# Patient Record
Sex: Male | Born: 1955 | Marital: Single
Health system: Southern US, Community
[De-identification: ages and names within clinical notes are randomized; demographics above are authoritative.]

## PROBLEM LIST (undated history)

## (undated) DIAGNOSIS — C3490 Malignant neoplasm of unspecified part of unspecified bronchus or lung: Secondary | ICD-10-CM

## (undated) DIAGNOSIS — I469 Cardiac arrest, cause unspecified: Secondary | ICD-10-CM

## (undated) DIAGNOSIS — F329 Major depressive disorder, single episode, unspecified: Secondary | ICD-10-CM

## (undated) DIAGNOSIS — R600 Localized edema: Secondary | ICD-10-CM

## (undated) DIAGNOSIS — F419 Anxiety disorder, unspecified: Secondary | ICD-10-CM

## (undated) DIAGNOSIS — G8929 Other chronic pain: Secondary | ICD-10-CM

## (undated) DIAGNOSIS — M549 Dorsalgia, unspecified: Secondary | ICD-10-CM

## (undated) DIAGNOSIS — I482 Chronic atrial fibrillation, unspecified: Secondary | ICD-10-CM

## (undated) DIAGNOSIS — B1920 Unspecified viral hepatitis C without hepatic coma: Secondary | ICD-10-CM

## (undated) DIAGNOSIS — J449 Chronic obstructive pulmonary disease, unspecified: Secondary | ICD-10-CM

## (undated) DIAGNOSIS — J9621 Acute and chronic respiratory failure with hypoxia: Secondary | ICD-10-CM

## (undated) DIAGNOSIS — F32A Depression, unspecified: Secondary | ICD-10-CM

## (undated) DIAGNOSIS — I509 Heart failure, unspecified: Secondary | ICD-10-CM

## (undated) DIAGNOSIS — J189 Pneumonia, unspecified organism: Secondary | ICD-10-CM

## (undated) HISTORY — PX: APPENDECTOMY: SHX54

## (undated) HISTORY — PX: TRACHEOSTOMY: SUR1362

## (undated) HISTORY — PX: TONSILLECTOMY: SUR1361

---

## 2018-06-11 ENCOUNTER — Inpatient Hospital Stay
Admit: 2018-06-11 | Discharge: 2018-07-24 | Disposition: A | Discharge status: Skilled Nursing Facility | Payer: Medicare Other | Attending: Internal Medicine | Admitting: Internal Medicine

## 2018-06-11 ENCOUNTER — Institutional Professional Consult (permissible substitution) (HOSPITAL_COMMUNITY): Payer: Medicare Other

## 2018-06-11 DIAGNOSIS — D649 Anemia, unspecified: Secondary | ICD-10-CM

## 2018-06-11 DIAGNOSIS — J9690 Respiratory failure, unspecified, unspecified whether with hypoxia or hypercapnia: Secondary | ICD-10-CM

## 2018-06-11 DIAGNOSIS — K769 Liver disease, unspecified: Secondary | ICD-10-CM

## 2018-06-11 DIAGNOSIS — R195 Other fecal abnormalities: Secondary | ICD-10-CM

## 2018-06-11 DIAGNOSIS — I469 Cardiac arrest, cause unspecified: Secondary | ICD-10-CM | POA: Diagnosis present

## 2018-06-11 DIAGNOSIS — J939 Pneumothorax, unspecified: Secondary | ICD-10-CM

## 2018-06-11 DIAGNOSIS — Z9889 Other specified postprocedural states: Secondary | ICD-10-CM

## 2018-06-11 DIAGNOSIS — Z930 Tracheostomy status: Secondary | ICD-10-CM

## 2018-06-11 DIAGNOSIS — I509 Heart failure, unspecified: Secondary | ICD-10-CM

## 2018-06-11 DIAGNOSIS — J189 Pneumonia, unspecified organism: Secondary | ICD-10-CM

## 2018-06-11 DIAGNOSIS — J449 Chronic obstructive pulmonary disease, unspecified: Secondary | ICD-10-CM | POA: Diagnosis present

## 2018-06-11 DIAGNOSIS — R600 Localized edema: Secondary | ICD-10-CM | POA: Diagnosis present

## 2018-06-11 DIAGNOSIS — Z931 Gastrostomy status: Secondary | ICD-10-CM

## 2018-06-11 DIAGNOSIS — Z4659 Encounter for fitting and adjustment of other gastrointestinal appliance and device: Secondary | ICD-10-CM

## 2018-06-11 DIAGNOSIS — J9621 Acute and chronic respiratory failure with hypoxia: Secondary | ICD-10-CM | POA: Diagnosis present

## 2018-06-11 HISTORY — DX: Heart failure, unspecified: I50.9

## 2018-06-11 HISTORY — DX: Localized edema: R60.0

## 2018-06-11 HISTORY — DX: Malignant neoplasm of unspecified part of unspecified bronchus or lung: C34.90

## 2018-06-11 HISTORY — DX: Pneumonia, unspecified organism: J18.9

## 2018-06-11 HISTORY — DX: Unspecified viral hepatitis C without hepatic coma: B19.20

## 2018-06-11 HISTORY — DX: Dorsalgia, unspecified: M54.9

## 2018-06-11 HISTORY — DX: Chronic obstructive pulmonary disease, unspecified: J44.9

## 2018-06-11 HISTORY — DX: Anxiety disorder, unspecified: F41.9

## 2018-06-11 HISTORY — DX: Depression, unspecified: F32.A

## 2018-06-11 HISTORY — DX: Cardiac arrest, cause unspecified: I46.9

## 2018-06-11 HISTORY — DX: Chronic atrial fibrillation, unspecified: I48.20

## 2018-06-11 HISTORY — DX: Other chronic pain: G89.29

## 2018-06-11 HISTORY — DX: Acute and chronic respiratory failure with hypoxia: J96.21

## 2018-06-11 HISTORY — DX: Major depressive disorder, single episode, unspecified: F32.9

## 2018-06-11 LAB — BLOOD GAS, ARTERIAL
Acid-Base Excess: 10.1 mmol/L — ABNORMAL HIGH (ref 0.0–2.0)
Bicarbonate: 34.6 mmol/L — ABNORMAL HIGH (ref 20.0–28.0)
FIO2: 100
O2 SAT: 89.9 %
PEEP/CPAP: 10 cmH2O
Patient temperature: 98.6
RATE: 20 resp/min
VT: 600 mL
pCO2 arterial: 50.1 mmHg — ABNORMAL HIGH (ref 32.0–48.0)
pH, Arterial: 7.453 — ABNORMAL HIGH (ref 7.350–7.450)
pO2, Arterial: 60 mmHg — ABNORMAL LOW (ref 83.0–108.0)

## 2018-06-12 ENCOUNTER — Other Ambulatory Visit (HOSPITAL_COMMUNITY): Payer: Medicare Other

## 2018-06-12 DIAGNOSIS — I469 Cardiac arrest, cause unspecified: Secondary | ICD-10-CM | POA: Diagnosis not present

## 2018-06-12 DIAGNOSIS — J9621 Acute and chronic respiratory failure with hypoxia: Secondary | ICD-10-CM | POA: Diagnosis not present

## 2018-06-12 DIAGNOSIS — R600 Localized edema: Secondary | ICD-10-CM

## 2018-06-12 DIAGNOSIS — I509 Heart failure, unspecified: Secondary | ICD-10-CM

## 2018-06-12 DIAGNOSIS — J449 Chronic obstructive pulmonary disease, unspecified: Secondary | ICD-10-CM

## 2018-06-12 DIAGNOSIS — J189 Pneumonia, unspecified organism: Secondary | ICD-10-CM

## 2018-06-12 LAB — BLOOD GAS, ARTERIAL
ACID-BASE EXCESS: 10.8 mmol/L — AB (ref 0.0–2.0)
Bicarbonate: 35.2 mmol/L — ABNORMAL HIGH (ref 20.0–28.0)
FIO2: 100
LHR: 20 {breaths}/min
MECHVT: 600 mL
O2 Saturation: 58.1 %
PATIENT TEMPERATURE: 98.6
PCO2 ART: 50 mmHg — AB (ref 32.0–48.0)
PEEP/CPAP: 12 cmH2O
pH, Arterial: 7.461 — ABNORMAL HIGH (ref 7.350–7.450)
pO2, Arterial: 32.7 mmHg — CL (ref 83.0–108.0)

## 2018-06-12 LAB — CBC WITH DIFFERENTIAL/PLATELET
Abs Immature Granulocytes: 0.1 10*3/uL (ref 0.0–0.1)
BASOS ABS: 0 10*3/uL (ref 0.0–0.1)
Basophils Relative: 0 %
Eosinophils Absolute: 0.2 10*3/uL (ref 0.0–0.7)
Eosinophils Relative: 2 %
HEMATOCRIT: 36.2 % — AB (ref 39.0–52.0)
HEMOGLOBIN: 11 g/dL — AB (ref 13.0–17.0)
Immature Granulocytes: 1 %
LYMPHS PCT: 8 %
Lymphs Abs: 0.7 10*3/uL (ref 0.7–4.0)
MCH: 31.2 pg (ref 26.0–34.0)
MCHC: 30.4 g/dL (ref 30.0–36.0)
MCV: 102.5 fL — AB (ref 78.0–100.0)
MONO ABS: 0.7 10*3/uL (ref 0.1–1.0)
Monocytes Relative: 8 %
Neutro Abs: 6.8 10*3/uL (ref 1.7–7.7)
Neutrophils Relative %: 81 %
Platelets: 166 10*3/uL (ref 150–400)
RBC: 3.53 MIL/uL — ABNORMAL LOW (ref 4.22–5.81)
RDW: 13.9 % (ref 11.5–15.5)
WBC: 8.5 10*3/uL (ref 4.0–10.5)

## 2018-06-12 LAB — COMPREHENSIVE METABOLIC PANEL
ALK PHOS: 80 U/L (ref 38–126)
ALT: 18 U/L (ref 0–44)
AST: 16 U/L (ref 15–41)
Albumin: 2.6 g/dL — ABNORMAL LOW (ref 3.5–5.0)
Anion gap: 10 (ref 5–15)
BILIRUBIN TOTAL: 1.4 mg/dL — AB (ref 0.3–1.2)
BUN: 20 mg/dL (ref 8–23)
CO2: 33 mmol/L — ABNORMAL HIGH (ref 22–32)
Calcium: 8.8 mg/dL — ABNORMAL LOW (ref 8.9–10.3)
Chloride: 98 mmol/L (ref 98–111)
Creatinine, Ser: 0.68 mg/dL (ref 0.61–1.24)
GFR calc Af Amer: >60 mL/min (ref >60)
GFR calc non Af Amer: >60 mL/min (ref >60)
Glucose, Bld: 97 mg/dL (ref 70–99)
POTASSIUM: 3.8 mmol/L (ref 3.5–5.1)
Sodium: 141 mmol/L (ref 135–145)
TOTAL PROTEIN: 6.3 g/dL — AB (ref 6.5–8.1)

## 2018-06-12 NOTE — Consult Note (Signed)
Pulmonary [place]  PULMONARY SERVICE  Date of Service: 06/12/2018  PULMONARY CRITICAL CARE CONSULT   Rick Moyer  GEZ:[id number]  DOB: 01/15/1956   DOA: 06/11/2018  Referring Physician: Merton Border, MD  HPI: Rick Moyer is a 62 y.o. male seen for follow up of Acute on Chronic Respiratory Failure.  Patient was transferred to our facility for further management and weaning.  Apparently has a history of very severe COPD chronic leg swelling came into the hospital after having suffered a cardiac arrest and had a pole.  When EMS arrived patient was in full cardiac arrest.  Patient was started on ACLS protocol and intubated.  Patient was then transferred to the hospital and admitted to the ICU.  Apparently was felt to have a pneumonia treated with antibiotics.  Patient also was noted to have a pneumothorax requiring a chest tube.  Patient also developed complications related to development of atrial fibrillation which was treated with anticoagulation.  Patient was unable to wean off the ventilator.  Eventually had to have a tracheostomy placed.  Transferred now to our facility for management and weaning  Review of Systems:  ROS performed and is unremarkable other than noted above.  Past Medical History:  Diagnosis Date  . Acute on chronic respiratory failure with hypoxia ([place])   . Anxiety   . Bilateral lower extremity edema   . Cardiac arrest ([place])   . Chronic atrial fibrillation ([place])   . Chronic back pain   . Chronic congestive heart failure ([place])   . COPD, very severe ([place])   . Depression   . Hepatitis C   . Lung cancer [facility])     Past Surgical History:  Procedure Laterality Date  . APPENDECTOMY    . TONSILLECTOMY    . TRACHEOSTOMY      Social History:    reports that he has quit smoking. He has never used smokeless tobacco. He reports that he drank alcohol. He reports that he has current or past drug history.  Family  History: Non-Contributory to the present illness    Medications: Reviewed on Rounds  Physical Exam:  Vitals: Temperature 99.0 pulse 94 respiratory rate 18 blood pressure 124/78 saturations are 90%  Ventilator Settings mode of ventilation assist control FiO2 85% tidal volume 600 PEEP 10  . General: Comfortable at this time . Eyes: Grossly normal lids, irises & conjunctiva . ENT: grossly tongue is normal . Neck: no obvious mass . Cardiovascular: S1-S2 normal no gallop . Respiratory: Few scattered distant rhonchi are noted . Abdomen: Soft and nontender . Skin: no rash seen on limited exam . Musculoskeletal: not rigid . Psychiatric:unable to assess . Neurologic: no seizure no involuntary movements         Labs on Admission:  Basic Metabolic Panel: Recent Labs  Lab 06/12/18 0630  NA 141  K 3.8  CL 98  CO2 33*  GLUCOSE 97  BUN 20  CREATININE 0.68  CALCIUM 8.8*    Recent Labs  Lab 06/11/18 2020 06/12/18 0030  PHART 7.453* 7.461*  PCO2ART 50.1* 50.0*  PO2ART 60.0* 32.7*  HCO3 34.6* 35.2*  O2SAT 89.9 58.1    Liver Function Tests: Recent Labs  Lab 06/12/18 0630  AST 16  ALT 18  ALKPHOS 80  BILITOT 1.4*  PROT 6.3*  ALBUMIN 2.6*   No results for input(s): LIPASE, AMYLASE in the last 168 hours. No results for input(s): AMMONIA in the last 168 hours.  CBC: Recent  Labs  Lab 06/12/18 0630  WBC 8.5  NEUTROABS 6.8  HGB 11.0*  HCT 36.2*  MCV 102.5*  PLT 166    Cardiac Enzymes: No results for input(s): CKTOTAL, CKMB, CKMBINDEX, TROPONINI in the last 168 hours.  BNP (last 3 results) No results for input(s): BNP in the last 8760 hours.  ProBNP (last 3 results) No results for input(s): PROBNP in the last 8760 hours.   Radiological Exams on Admission: Dg Abd 1 View  Result Date: 06/12/2018 CLINICAL DATA:  Ventilated patient. Subsequent nasogastric tube placement. EXAM: ABDOMEN - 1 VIEW COMPARISON:  Abdominal radiograph of June 12, 2018  FINDINGS: There has been interval advancement of the nasogastric tube. Both the tip and proximal port appear lie within the gastric body. IMPRESSION: Interval advancement of the nasogastric tube with good positioning of both the tip and proximal port in the gastric body. Electronically Signed   By: David  Martinique M.D.   On: 06/12/2018 10:49   Dg Abd 1 View  Result Date: 06/12/2018 CLINICAL DATA:  Status post nasogastric tube placement. Bill aided patient with tracheostomy. EXAM: ABDOMEN - 1 VIEW COMPARISON:  Abdominal radiograph of June 11, 2018 FINDINGS: The radiodense tipped feeding tube is been removed. An esophagogastric tube has been placed whose tip projects in the gastric cardia and the proximal port above the GE junction. IMPRESSION: Advancement of the nasogastric tube by between 10 and 15 cm is recommended to assure that both the proximal port and tip lie below the GE junction. Electronically Signed   By: David  Martinique M.D.   On: 06/12/2018 09:27   Dg Chest Port 1 View  Result Date: 06/12/2018 CLINICAL DATA:  Hypoxia.  History of chest tube placement. EXAM: PORTABLE CHEST 1 VIEW COMPARISON:  Chest radiograph yesterday at 1535 hour FINDINGS: Previous enteric tube is no longer visualized. Previous right central line is no longer visualized. Tracheostomy tube projects over the thoracic inlet. Left upper extremity PICC tip in the proximal SVC. The lungs are hyperinflated with emphysema. Worsening pleuroparenchymal opacity at the right lung base. Left lung base and costophrenic angle excluded from the field of view. The heart is enlarged. No visualized pneumothorax. The left rib fractures on prior exam are not as well seen currently. IMPRESSION: 1. Progressive pleuroparenchymal opacity at the right lung base suspicious for increasing pleural effusion and atelectasis or airspace disease. 2. Again seen hyperinflation and likely emphysema. Left lung base and costophrenic angle not included in the field  of view. 3. Right central line and enteric tube have been removed in the interim. Tracheostomy tube at the thoracic inlet. 4. No visualized pneumothorax. Electronically Signed   By: Keith Rake M.D.   On: 06/12/2018 01:27   Dg Chest Port 1 View  Result Date: 06/11/2018 CLINICAL DATA:  Pneumothorax, line and tube placement EXAM: PORTABLE CHEST 1 VIEW COMPARISON:  None. FINDINGS: Left upper extremity catheter tip overlies the SVC. Esophageal tube extends below diaphragm but is not included. Apparent tracheostomy tube, tip poorly visible. Hyperinflation with probable emphysematous disease. Increased airspace disease at the right greater than left lung bases. Mild cardiomegaly with aortic atherosclerosis. Vague pleural and parenchymal opacity in the left lower lung. No definitive pneumothorax. Old appearing left-sided rib fractures. Age indeterminate left third, fourth, and fifth rib fractures. IMPRESSION: 1. No definitive pneumothorax seen 2. Hyperinflation with probable emphysematous disease. Increased airspace disease at the right greater than left lung base may reflect atelectasis or pneumonia. Pleural and parenchymal opacity in the left lower lung, potential  scarring versus nodule. No priors available for comparison. Consider correlation with chest CT. 3. Mild cardiomegaly. 4. Multiple left-sided rib fractures, left third through fifth rib fractures appear age indeterminate/possibly acute. Electronically Signed   By: Donavan Foil M.D.   On: 06/11/2018 16:00   Dg Abd Portable 1v  Result Date: 06/11/2018 CLINICAL DATA:  NG tube placement EXAM: PORTABLE ABDOMEN - 1 VIEW COMPARISON:  None. FINDINGS: Esophageal tube tip overlies the expected location of second portion of duodenum. Visible gas pattern is nonobstructed IMPRESSION: Esophageal tube tip projects over expected location of proximal duodenum. Electronically Signed   By: Donavan Foil M.D.   On: 06/11/2018 16:00    Assessment/Plan Active  Problems:   Acute on chronic respiratory failure with hypoxia (HCC)   COPD, very severe (HCC)   Bilateral lower extremity edema   Chronic congestive heart failure (HCC)   Cardiac arrest (HCC)   Bilateral pneumonia   1. Acute on chronic respiratory failure with hypoxia patient has significant hypoxia at this time requiring 85% FiO2.  The chest x-ray shows emphysematous changes with airspace disease reflecting probable pneumonia.  Recommended doing a possible CT scan.  Patient was also noted to have multiple rib fractures.  We are going to try to titrate the oxygen down as tolerated currently is on a PEEP of 10 we will try to adjust the PEEP according to the FiO2 requirements. 2. Bilateral pneumonitis probable pneumonia represented on the chest x-ray.  Would consider doing a CT scan of the chest to further evaluate. 3. Severe COPD patient will be continued on current regimen.  Requiring ongoing oxygen therapy as well as aggressive meds for his COPD. 4. Bilateral edema monitor fluid status diuresis tolerated. 5. Chronic congestive heart failure causing above we will continue with supportive care. 6. Status post cardiac arrest treated we will continue to follow  I have personally seen and evaluated the patient, evaluated laboratory and imaging results, formulated the assessment and plan and placed orders. The Patient requires high complexity decision making for assessment and support.  Case was discussed on Rounds with the Respiratory Therapy Staff Time Spent 33minutes  Allyne Gee, MD [facility] Pulmonary Critical Care Medicine Sleep Medicine

## 2018-06-13 DIAGNOSIS — I469 Cardiac arrest, cause unspecified: Secondary | ICD-10-CM | POA: Diagnosis not present

## 2018-06-13 DIAGNOSIS — J9621 Acute and chronic respiratory failure with hypoxia: Secondary | ICD-10-CM | POA: Diagnosis not present

## 2018-06-13 DIAGNOSIS — R600 Localized edema: Secondary | ICD-10-CM | POA: Diagnosis not present

## 2018-06-13 DIAGNOSIS — J189 Pneumonia, unspecified organism: Secondary | ICD-10-CM | POA: Diagnosis not present

## 2018-06-13 NOTE — Progress Notes (Signed)
Pulmonary Critical Care Medicine [place]   PULMONARY CRITICAL CARE SERVICE  PROGRESS NOTE  Date of Service: 06/13/2018  Rick Moyer  HDQ:[id number]  DOB: 1956/08/16   DOA: 06/11/2018  Referring Physician: Merton Border, MD  HPI: Rick Moyer is a 62 y.o. male seen for follow up of Acute on Chronic Respiratory Failure.  Patient is still on 65% oxygen.  His volumes are about patient has not been able to tolerate the weaning.  He is still also on a PEEP of 10  Medications: Reviewed on Rounds  Physical Exam:  Vitals: Temperature 97.1 pulse 85 respiratory rate 27 blood pressure 100/57 saturations are 94%  Ventilator Settings mode of ventilation is assist control FiO2 65% tidal volume 800 PEEP 10  . General: Comfortable at this time . Eyes: Grossly normal lids, irises & conjunctiva . ENT: grossly tongue is normal . Neck: no obvious mass . Cardiovascular: S1 S2 normal no gallop . Respiratory: No rhonchi or rales are noted . Abdomen: soft . Skin: no rash seen on limited exam . Musculoskeletal: not rigid . Psychiatric:unable to assess . Neurologic: no seizure no involuntary movements         Lab Data:   Basic Metabolic Panel: Recent Labs  Lab 06/12/18 0630  NA 141  K 3.8  CL 98  CO2 33*  GLUCOSE 97  BUN 20  CREATININE 0.68  CALCIUM 8.8*    ABG: Recent Labs  Lab 06/11/18 2020 06/12/18 0030  PHART 7.453* 7.461*  PCO2ART 50.1* 50.0*  PO2ART 60.0* 32.7*  HCO3 34.6* 35.2*  O2SAT 89.9 58.1    Liver Function Tests: Recent Labs  Lab 06/12/18 0630  AST 16  ALT 18  ALKPHOS 80  BILITOT 1.4*  PROT 6.3*  ALBUMIN 2.6*   No results for input(s): LIPASE, AMYLASE in the last 168 hours. No results for input(s): AMMONIA in the last 168 hours.  CBC: Recent Labs  Lab 06/12/18 0630  WBC 8.5  NEUTROABS 6.8  HGB 11.0*  HCT 36.2*  MCV 102.5*  PLT 166    Cardiac Enzymes: No results for input(s): CKTOTAL, CKMB, CKMBINDEX, TROPONINI  in the last 168 hours.  BNP (last 3 results) No results for input(s): BNP in the last 8760 hours.  ProBNP (last 3 results) No results for input(s): PROBNP in the last 8760 hours.  Radiological Exams: Dg Abd 1 View  Result Date: 06/12/2018 CLINICAL DATA:  Ventilated patient. Subsequent nasogastric tube placement. EXAM: ABDOMEN - 1 VIEW COMPARISON:  Abdominal radiograph of June 12, 2018 FINDINGS: There has been interval advancement of the nasogastric tube. Both the tip and proximal port appear lie within the gastric body. IMPRESSION: Interval advancement of the nasogastric tube with good positioning of both the tip and proximal port in the gastric body. Electronically Signed   By: David  Martinique M.D.   On: 06/12/2018 10:49   Dg Abd 1 View  Result Date: 06/12/2018 CLINICAL DATA:  Status post nasogastric tube placement. Bill aided patient with tracheostomy. EXAM: ABDOMEN - 1 VIEW COMPARISON:  Abdominal radiograph of June 11, 2018 FINDINGS: The radiodense tipped feeding tube is been removed. An esophagogastric tube has been placed whose tip projects in the gastric cardia and the proximal port above the GE junction. IMPRESSION: Advancement of the nasogastric tube by between 10 and 15 cm is recommended to assure that both the proximal port and tip lie below the GE junction. Electronically Signed   By: David  Martinique M.D.   On: 06/12/2018  09:27   Dg Chest Port 1 View  Result Date: 06/12/2018 CLINICAL DATA:  Hypoxia.  History of chest tube placement. EXAM: PORTABLE CHEST 1 VIEW COMPARISON:  Chest radiograph yesterday at 1535 hour FINDINGS: Previous enteric tube is no longer visualized. Previous right central line is no longer visualized. Tracheostomy tube projects over the thoracic inlet. Left upper extremity PICC tip in the proximal SVC. The lungs are hyperinflated with emphysema. Worsening pleuroparenchymal opacity at the right lung base. Left lung base and costophrenic angle excluded from the  field of view. The heart is enlarged. No visualized pneumothorax. The left rib fractures on prior exam are not as well seen currently. IMPRESSION: 1. Progressive pleuroparenchymal opacity at the right lung base suspicious for increasing pleural effusion and atelectasis or airspace disease. 2. Again seen hyperinflation and likely emphysema. Left lung base and costophrenic angle not included in the field of view. 3. Right central line and enteric tube have been removed in the interim. Tracheostomy tube at the thoracic inlet. 4. No visualized pneumothorax. Electronically Signed   By: Keith Rake M.D.   On: 06/12/2018 01:27   Dg Chest Port 1 View  Result Date: 06/11/2018 CLINICAL DATA:  Pneumothorax, line and tube placement EXAM: PORTABLE CHEST 1 VIEW COMPARISON:  None. FINDINGS: Left upper extremity catheter tip overlies the SVC. Esophageal tube extends below diaphragm but is not included. Apparent tracheostomy tube, tip poorly visible. Hyperinflation with probable emphysematous disease. Increased airspace disease at the right greater than left lung bases. Mild cardiomegaly with aortic atherosclerosis. Vague pleural and parenchymal opacity in the left lower lung. No definitive pneumothorax. Old appearing left-sided rib fractures. Age indeterminate left third, fourth, and fifth rib fractures. IMPRESSION: 1. No definitive pneumothorax seen 2. Hyperinflation with probable emphysematous disease. Increased airspace disease at the right greater than left lung base may reflect atelectasis or pneumonia. Pleural and parenchymal opacity in the left lower lung, potential scarring versus nodule. No priors available for comparison. Consider correlation with chest CT. 3. Mild cardiomegaly. 4. Multiple left-sided rib fractures, left third through fifth rib fractures appear age indeterminate/possibly acute. Electronically Signed   By: Donavan Foil M.D.   On: 06/11/2018 16:00   Dg Abd Portable 1v  Result Date:  06/11/2018 CLINICAL DATA:  NG tube placement EXAM: PORTABLE ABDOMEN - 1 VIEW COMPARISON:  None. FINDINGS: Esophageal tube tip overlies the expected location of second portion of duodenum. Visible gas pattern is nonobstructed IMPRESSION: Esophageal tube tip projects over expected location of proximal duodenum. Electronically Signed   By: Donavan Foil M.D.   On: 06/11/2018 16:00    Assessment/Plan Active Problems:   Acute on chronic respiratory failure with hypoxia (HCC)   COPD, very severe (HCC)   Bilateral lower extremity edema   Chronic congestive heart failure (HCC)   Cardiac arrest (HCC)   Bilateral pneumonia   1. Acute on chronic respiratory failure with hypoxia patient still remains on full support and is on high oxygen requirements.  We will try to titrate oxygen down titrate PEEP after oxygen.  Follow-up chest x-ray had shown multiple rib fractures some hyperinflation no pneumothorax and also some opacities could very well represent pulmonary contusion 2. COPD severe disease we will continue with the present management. 3. Bilateral edema monitor fluid status titrate as tolerated and diuresis tolerated. 4. Chronic congestive heart failure at baseline we will continue with secretion management pulmonary toilet. 5. Cardiac arrest rhythm is been stable 6. Bilateral pneumonia treated with antibiotics we will follow   I  have personally seen and evaluated the patient, evaluated laboratory and imaging results, formulated the assessment and plan and placed orders. The Patient requires high complexity decision making for assessment and support.  Case was discussed on Rounds with the Respiratory Therapy Staff  Allyne Gee, MD [facility] Pulmonary Critical Care Medicine Sleep Medicine

## 2018-06-16 LAB — BASIC METABOLIC PANEL
Anion gap: 9 (ref 5–15)
BUN: 33 mg/dL — AB (ref 8–23)
CHLORIDE: 95 mmol/L — AB (ref 98–111)
CO2: 33 mmol/L — ABNORMAL HIGH (ref 22–32)
CREATININE: 0.81 mg/dL (ref 0.61–1.24)
Calcium: 8.8 mg/dL — ABNORMAL LOW (ref 8.9–10.3)
GFR calc Af Amer: >60 mL/min (ref >60)
GFR calc non Af Amer: >60 mL/min (ref >60)
Glucose, Bld: 173 mg/dL — ABNORMAL HIGH (ref 70–99)
Potassium: 3.7 mmol/L (ref 3.5–5.1)
SODIUM: 137 mmol/L (ref 135–145)

## 2018-06-17 ENCOUNTER — Encounter: Payer: Self-pay | Admitting: Internal Medicine

## 2018-06-17 DIAGNOSIS — I509 Heart failure, unspecified: Secondary | ICD-10-CM

## 2018-06-17 DIAGNOSIS — I469 Cardiac arrest, cause unspecified: Secondary | ICD-10-CM | POA: Diagnosis present

## 2018-06-17 DIAGNOSIS — J9621 Acute and chronic respiratory failure with hypoxia: Secondary | ICD-10-CM | POA: Diagnosis not present

## 2018-06-17 DIAGNOSIS — J189 Pneumonia, unspecified organism: Secondary | ICD-10-CM

## 2018-06-17 DIAGNOSIS — R600 Localized edema: Secondary | ICD-10-CM | POA: Diagnosis not present

## 2018-06-17 DIAGNOSIS — J449 Chronic obstructive pulmonary disease, unspecified: Secondary | ICD-10-CM | POA: Diagnosis present

## 2018-06-17 HISTORY — DX: Pneumonia, unspecified organism: J18.9

## 2018-06-17 NOTE — Progress Notes (Signed)
Pulmonary Critical Care Medicine [place]   PULMONARY CRITICAL CARE SERVICE  PROGRESS NOTE  Date of Service: 06/17/2018  Rick Moyer  ZOX:[id number]  DOB: 1955-10-14   DOA: 06/11/2018  Referring Physician: Merton Border, MD  HPI: Rick Moyer is a 62 y.o. male seen for follow up of Acute on Chronic Respiratory Failure.  Patient is on full support right now is on assist control mode has been on 60% oxygen.  Volumes are still high at about 800 currently PEEP was decreased down to 7  Medications: Reviewed on Rounds  Physical Exam:  Vitals: Temperature 97.6 pulse 72 respiratory rate 18 blood pressure 147/61 saturations 98%  Ventilator Settings mode of ventilation assist control FiO2 60% tidal volume 900 PEEP 7  . General: Comfortable at this time . Eyes: Grossly normal lids, irises & conjunctiva . ENT: grossly tongue is normal . Neck: no obvious mass . Cardiovascular: S1 S2 normal no gallop . Respiratory: Coarse breath sounds some rhonchi are noted . Abdomen: soft . Skin: no rash seen on limited exam . Musculoskeletal: not rigid . Psychiatric:unable to assess . Neurologic: no seizure no involuntary movements         Lab Data:   Basic Metabolic Panel: Recent Labs  Lab 06/12/18 0630 06/16/18 0618  NA 141 137  K 3.8 3.7  CL 98 95*  CO2 33* 33*  GLUCOSE 97 173*  BUN 20 33*  CREATININE 0.68 0.81  CALCIUM 8.8* 8.8*    ABG: Recent Labs  Lab 06/11/18 2020 06/12/18 0030  PHART 7.453* 7.461*  PCO2ART 50.1* 50.0*  PO2ART 60.0* 32.7*  HCO3 34.6* 35.2*  O2SAT 89.9 58.1    Liver Function Tests: Recent Labs  Lab 06/12/18 0630  AST 16  ALT 18  ALKPHOS 80  BILITOT 1.4*  PROT 6.3*  ALBUMIN 2.6*   No results for input(s): LIPASE, AMYLASE in the last 168 hours. No results for input(s): AMMONIA in the last 168 hours.  CBC: Recent Labs  Lab 06/12/18 0630  WBC 8.5  NEUTROABS 6.8  HGB 11.0*  HCT 36.2*  MCV 102.5*  PLT 166     Cardiac Enzymes: No results for input(s): CKTOTAL, CKMB, CKMBINDEX, TROPONINI in the last 168 hours.  BNP (last 3 results) No results for input(s): BNP in the last 8760 hours.  ProBNP (last 3 results) No results for input(s): PROBNP in the last 8760 hours.  Radiological Exams: No results found.  Assessment/Plan Active Problems:   Acute on chronic respiratory failure with hypoxia (HCC)   COPD, very severe (HCC)   Bilateral lower extremity edema   Chronic congestive heart failure (HCC)   Cardiac arrest (HCC)   Bilateral pneumonia   1. Acute on chronic respiratory failure with hypoxia we will continue with full vent support right now I will try to keep the volumes on the lower side.  Simply because of the history of pneumothorax. 2. Very severe COPD continue with present management. 3. Bilateral edema treated we will continue with supportive care.  Follow the fluid status 4. Chronic congestive heart failure at baseline monitor fluid status 5. Cardiac arrest rhythm has been stable 6. Bilateral pneumonia treated with antibiotics we will monitor and also follow-up on x-ray   I have personally seen and evaluated the patient, evaluated laboratory and imaging results, formulated the assessment and plan and placed orders. The Patient requires high complexity decision making for assessment and support.  Case was discussed on Rounds with the Respiratory Therapy Staff  Allyne Gee, MD [facility] Pulmonary Critical Care Medicine Sleep Medicine

## 2018-06-18 DIAGNOSIS — J9621 Acute and chronic respiratory failure with hypoxia: Secondary | ICD-10-CM | POA: Diagnosis not present

## 2018-06-18 DIAGNOSIS — I469 Cardiac arrest, cause unspecified: Secondary | ICD-10-CM | POA: Diagnosis not present

## 2018-06-18 DIAGNOSIS — R600 Localized edema: Secondary | ICD-10-CM | POA: Diagnosis not present

## 2018-06-18 DIAGNOSIS — J189 Pneumonia, unspecified organism: Secondary | ICD-10-CM | POA: Diagnosis not present

## 2018-06-18 NOTE — Progress Notes (Signed)
Pulmonary Critical Care Medicine [place]   PULMONARY CRITICAL CARE SERVICE  PROGRESS NOTE  Date of Service: 06/18/2018  Rick Moyer  HEN:[id number]  DOB: 1955-11-09   DOA: 06/11/2018  Referring Physician: Merton Border, MD  HPI: Rick Moyer is a 62 y.o. male seen for follow up of Acute on Chronic Respiratory Failure.  Remains on the ventilator and full support oxygen has been decreased down to 50% so he is doing a little bit better but still not able to fully wean.  Medications: Reviewed on Rounds  Physical Exam:  Vitals: Temperature 96.7 pulse 77 respiratory rate 17 blood pressure 108/77 saturations 94%  Ventilator Settings mode of ventilation assist control FiO2 50% tidal volume 600 PEEP 10  . General: Comfortable at this time . Eyes: Grossly normal lids, irises & conjunctiva . ENT: grossly tongue is normal . Neck: no obvious mass . Cardiovascular: S1 S2 normal no gallop . Respiratory: Few rhonchi no rales are noted . Abdomen: soft . Skin: no rash seen on limited exam . Musculoskeletal: not rigid . Psychiatric:unable to assess . Neurologic: no seizure no involuntary movements         Lab Data:   Basic Metabolic Panel: Recent Labs  Lab 06/12/18 0630 06/16/18 0618  NA 141 137  K 3.8 3.7  CL 98 95*  CO2 33* 33*  GLUCOSE 97 173*  BUN 20 33*  CREATININE 0.68 0.81  CALCIUM 8.8* 8.8*    ABG: Recent Labs  Lab 06/11/18 2020 06/12/18 0030  PHART 7.453* 7.461*  PCO2ART 50.1* 50.0*  PO2ART 60.0* 32.7*  HCO3 34.6* 35.2*  O2SAT 89.9 58.1    Liver Function Tests: Recent Labs  Lab 06/12/18 0630  AST 16  ALT 18  ALKPHOS 80  BILITOT 1.4*  PROT 6.3*  ALBUMIN 2.6*   No results for input(s): LIPASE, AMYLASE in the last 168 hours. No results for input(s): AMMONIA in the last 168 hours.  CBC: Recent Labs  Lab 06/12/18 0630  WBC 8.5  NEUTROABS 6.8  HGB 11.0*  HCT 36.2*  MCV 102.5*  PLT 166    Cardiac Enzymes: No  results for input(s): CKTOTAL, CKMB, CKMBINDEX, TROPONINI in the last 168 hours.  BNP (last 3 results) No results for input(s): BNP in the last 8760 hours.  ProBNP (last 3 results) No results for input(s): PROBNP in the last 8760 hours.  Radiological Exams: No results found.  Assessment/Plan Active Problems:   Acute on chronic respiratory failure with hypoxia (HCC)   COPD, very severe (HCC)   Bilateral lower extremity edema   Chronic congestive heart failure (HCC)   Cardiac arrest (HCC)   Bilateral pneumonia   1. Acute on chronic respiratory failure with hypoxia we will continue with full vent support try to continue to wean the FiO2 down.  Also try to wean the PEEP down and hopefully we can begin him on a pressure support wean. 2. COPD very severe continue with supportive care current medications as tolerated. 3. Bilateral lower extremity edema follow the fluid status diuresis as tolerated. 4. Chronic congestive heart failure as noted above diurese as tolerated.  We will continue with supportive care monitor fluid status 5. Status post cardiac arrest rhythm has been stable 6. Bilateral pneumonia follow-up x-ray as necessary has been treated   I have personally seen and evaluated the patient, evaluated laboratory and imaging results, formulated the assessment and plan and placed orders. The Patient requires high complexity decision making for assessment and support.  Case was discussed on Rounds with the Respiratory Therapy Staff  Allyne Gee, MD [facility] Pulmonary Critical Care Medicine Sleep Medicine

## 2018-06-19 DIAGNOSIS — J189 Pneumonia, unspecified organism: Secondary | ICD-10-CM | POA: Diagnosis not present

## 2018-06-19 DIAGNOSIS — R600 Localized edema: Secondary | ICD-10-CM | POA: Diagnosis not present

## 2018-06-19 DIAGNOSIS — J9621 Acute and chronic respiratory failure with hypoxia: Secondary | ICD-10-CM | POA: Diagnosis not present

## 2018-06-19 DIAGNOSIS — I469 Cardiac arrest, cause unspecified: Secondary | ICD-10-CM | POA: Diagnosis not present

## 2018-06-19 LAB — OCCULT BLOOD X 1 CARD TO LAB, STOOL: Fecal Occult Bld: POSITIVE — AB

## 2018-06-19 NOTE — Progress Notes (Signed)
Pulmonary Critical Care Medicine [place]   PULMONARY CRITICAL CARE SERVICE  PROGRESS NOTE  Date of Service: 06/19/2018  Rick Moyer  PYP:[id number]  DOB: Aug 18, 1956   DOA: 06/11/2018  Referring Physician: Merton Border, MD  HPI: Rick Moyer is a 62 y.o. male seen for follow up of Acute on Chronic Respiratory Failure.  Patient right now is on full support and is weaning on pressure support.  Currently is requiring 50% oxygen with a pressure support of 12 and a PEEP of 8  Medications: Reviewed on Rounds  Physical Exam:  Vitals: Temperature 98.0 pulse 74 respiratory rate 23 blood pressure 124/56 saturations 98%  Ventilator Settings mode of ventilation pressure support FiO2 50% tidal volume 341 pressure support 12 PEEP 8  . General: Comfortable at this time . Eyes: Grossly normal lids, irises & conjunctiva . ENT: grossly tongue is normal . Neck: no obvious mass . Cardiovascular: S1 S2 normal no gallop . Respiratory: No rhonchi or rales are noted . Abdomen: soft . Skin: no rash seen on limited exam . Musculoskeletal: not rigid . Psychiatric:unable to assess . Neurologic: no seizure no involuntary movements         Lab Data:   Basic Metabolic Panel: Recent Labs  Lab 06/16/18 0618  NA 137  K 3.7  CL 95*  CO2 33*  GLUCOSE 173*  BUN 33*  CREATININE 0.81  CALCIUM 8.8*    ABG: No results for input(s): PHART, PCO2ART, PO2ART, HCO3, O2SAT in the last 168 hours.  Liver Function Tests: No results for input(s): AST, ALT, ALKPHOS, BILITOT, PROT, ALBUMIN in the last 168 hours. No results for input(s): LIPASE, AMYLASE in the last 168 hours. No results for input(s): AMMONIA in the last 168 hours.  CBC: No results for input(s): WBC, NEUTROABS, HGB, HCT, MCV, PLT in the last 168 hours.  Cardiac Enzymes: No results for input(s): CKTOTAL, CKMB, CKMBINDEX, TROPONINI in the last 168 hours.  BNP (last 3 results) No results for input(s): BNP in the  last 8760 hours.  ProBNP (last 3 results) No results for input(s): PROBNP in the last 8760 hours.  Radiological Exams: No results found.  Assessment/Plan Active Problems:   Acute on chronic respiratory failure with hypoxia (HCC)   COPD, very severe (HCC)   Bilateral lower extremity edema   Chronic congestive heart failure (HCC)   Cardiac arrest (HCC)   Bilateral pneumonia   1. Acute on chronic respiratory failure with hypoxia we will continue with the weaning on pressure support mode.  Patient should be able to go as tolerated.  Continue with pulmonary toilet supportive care. 2. Severe COPD continue with present management. 3. Bilateral edema diuresis as tolerated. 4. Chronic congestive heart failure monitor fluid status. 5. Cardiac arrest rhythm has been stable 6. Bilateral pneumonia treated we will continue to follow-up x-rays as needed   I have personally seen and evaluated the patient, evaluated laboratory and imaging results, formulated the assessment and plan and placed orders. The Patient requires high complexity decision making for assessment and support.  Case was discussed on Rounds with the Respiratory Therapy Staff  Allyne Gee, MD [facility] Pulmonary Critical Care Medicine Sleep Medicine

## 2018-06-20 ENCOUNTER — Other Ambulatory Visit (HOSPITAL_COMMUNITY): Payer: Medicare Other

## 2018-06-20 DIAGNOSIS — J9621 Acute and chronic respiratory failure with hypoxia: Secondary | ICD-10-CM | POA: Diagnosis not present

## 2018-06-20 DIAGNOSIS — I469 Cardiac arrest, cause unspecified: Secondary | ICD-10-CM | POA: Diagnosis not present

## 2018-06-20 DIAGNOSIS — R600 Localized edema: Secondary | ICD-10-CM | POA: Diagnosis not present

## 2018-06-20 DIAGNOSIS — J189 Pneumonia, unspecified organism: Secondary | ICD-10-CM | POA: Diagnosis not present

## 2018-06-20 LAB — BASIC METABOLIC PANEL
ANION GAP: 13 (ref 5–15)
BUN: 59 mg/dL — ABNORMAL HIGH (ref 8–23)
CALCIUM: 8.8 mg/dL — AB (ref 8.9–10.3)
CO2: 33 mmol/L — ABNORMAL HIGH (ref 22–32)
Chloride: 98 mmol/L (ref 98–111)
Creatinine, Ser: 0.82 mg/dL (ref 0.61–1.24)
Glucose, Bld: 148 mg/dL — ABNORMAL HIGH (ref 70–99)
POTASSIUM: 3.2 mmol/L — AB (ref 3.5–5.1)
SODIUM: 144 mmol/L (ref 135–145)

## 2018-06-20 LAB — CBC
HEMATOCRIT: 36.9 % — AB (ref 39.0–52.0)
Hemoglobin: 11.6 g/dL — ABNORMAL LOW (ref 13.0–17.0)
MCH: 31.6 pg (ref 26.0–34.0)
MCHC: 31.4 g/dL (ref 30.0–36.0)
MCV: 100.5 fL — AB (ref 78.0–100.0)
PLATELETS: 176 10*3/uL (ref 150–400)
RBC: 3.67 MIL/uL — ABNORMAL LOW (ref 4.22–5.81)
RDW: 13.2 % (ref 11.5–15.5)
WBC: 9.8 10*3/uL (ref 4.0–10.5)

## 2018-06-20 LAB — POTASSIUM: POTASSIUM: 3.4 mmol/L — AB (ref 3.5–5.1)

## 2018-06-20 NOTE — Progress Notes (Signed)
Pulmonary Critical Care Medicine [name]   PULMONARY CRITICAL CARE SERVICE  PROGRESS NOTE  Date of Service: 06/20/2018  Rick Moyer  FKC:[id number]  DOB: 05-Apr-1956   DOA: 06/11/2018  Referring Physician: Merton Border, MD  HPI: Rick Moyer is a 62 y.o. male seen for follow up of Acute on Chronic Respiratory Failure.  Patient was on oxygen to 65% saturations Was weaned down.  Currently is on assist control mode yesterday was on pressure support  Medications: Reviewed on Rounds  Physical Exam:  Vitals: Temperature 97.8 pulse 79 respiratory rate 18 blood pressure 116/70 saturations 100%  Ventilator Settings mode of ventilation assist control FiO2 65% tidal volume 754 cc PEEP of 6  . General: Comfortable at this time . Eyes: Grossly normal lids, irises & conjunctiva . ENT: grossly tongue is normal . Neck: no obvious mass . Cardiovascular: S1 S2 normal no gallop . Respiratory: No rhonchi or rales are noted . Abdomen: soft . Skin: no rash seen on limited exam . Musculoskeletal: not rigid . Psychiatric:unable to assess . Neurologic: no seizure no involuntary movements         Lab Data:   Basic Metabolic Panel: Recent Labs  Lab 06/16/18 0618 06/20/18 0553  NA 137 144  K 3.7 3.2*  CL 95* 98  CO2 33* 33*  GLUCOSE 173* 148*  BUN 33* 59*  CREATININE 0.81 0.82  CALCIUM 8.8* 8.8*    ABG: No results for input(s): PHART, PCO2ART, PO2ART, HCO3, O2SAT in the last 168 hours.  Liver Function Tests: No results for input(s): AST, ALT, ALKPHOS, BILITOT, PROT, ALBUMIN in the last 168 hours. No results for input(s): LIPASE, AMYLASE in the last 168 hours. No results for input(s): AMMONIA in the last 168 hours.  CBC: Recent Labs  Lab 06/20/18 0553  WBC 9.8  HGB 11.6*  HCT 36.9*  MCV 100.5*  PLT 176    Cardiac Enzymes: No results for input(s): CKTOTAL, CKMB, CKMBINDEX, TROPONINI in the last 168 hours.  BNP (last 3 results) No results for  input(s): BNP in the last 8760 hours.  ProBNP (last 3 results) No results for input(s): PROBNP in the last 8760 hours.  Radiological Exams: No results found.  Assessment/Plan Active Problems:   Acute on chronic respiratory failure with hypoxia (HCC)   COPD, very severe (HCC)   Bilateral lower extremity edema   Chronic congestive heart failure (HCC)   Cardiac arrest (HCC)   Bilateral pneumonia   1. Acute on chronic hypoxia patient will continue to wean we will try to wean the oxygen down first will titrate the PEEP as tolerated and then proceed back to pressure support. 2. COPD severe disease continue with present management. 3. Bilateral lower extremity edema diuresis as tolerated 4. Chronic congestive heart failure as above diuresis tolerated 5. Status post cardiac arrest rhythm has been stable 6. Bilateral pneumonia follow-up x-ray as needed   I have personally seen and evaluated the patient, evaluated laboratory and imaging results, formulated the assessment and plan and placed orders. The Patient requires high complexity decision making for assessment and support.  Case was discussed on Rounds with the Respiratory Therapy Staff  Allyne Gee, MD [facility] Pulmonary Critical Care Medicine Sleep Medicine

## 2018-06-21 DIAGNOSIS — J189 Pneumonia, unspecified organism: Secondary | ICD-10-CM | POA: Diagnosis not present

## 2018-06-21 DIAGNOSIS — I469 Cardiac arrest, cause unspecified: Secondary | ICD-10-CM | POA: Diagnosis not present

## 2018-06-21 DIAGNOSIS — J9621 Acute and chronic respiratory failure with hypoxia: Secondary | ICD-10-CM | POA: Diagnosis not present

## 2018-06-21 DIAGNOSIS — R600 Localized edema: Secondary | ICD-10-CM | POA: Diagnosis not present

## 2018-06-21 NOTE — Progress Notes (Signed)
Pulmonary Critical Care Medicine [place]   PULMONARY CRITICAL CARE SERVICE  PROGRESS NOTE  Date of Service: 06/21/2018  Rick Moyer  YQM:[id number]  DOB: [birth year]   DOA: 06/11/2018  Referring Physician: Merton Border, MD  HPI: Rick Moyer is a 62 y.o. male seen for follow up of Acute on Chronic Respiratory Failure.  Comfortable right now no distress.  Patient remains on assist control mode of his oxygen has been decreased down to 45%  Medications: Reviewed on Rounds  Physical Exam:  Vitals: Temperature 97.1 pulse 84 respiratory rate 22 blood pressure 97/66 saturations are 98%  Ventilator Settings mode of ventilation assist control FiO2 45% tidal volume 705 PEEP 6  . General: Comfortable at this time . Eyes: Grossly normal lids, irises & conjunctiva . ENT: grossly tongue is normal . Neck: no obvious mass . Cardiovascular: S1 S2 normal no gallop . Respiratory: No rhonchi or rales are noted at this time . Abdomen: soft . Skin: no rash seen on limited exam . Musculoskeletal: not rigid . Psychiatric:unable to assess . Neurologic: no seizure no involuntary movements         Lab Data:   Basic Metabolic Panel: Recent Labs  Lab 06/16/18 0618 06/20/18 0553 06/20/18 2259  NA 137 144  --   K 3.7 3.2* 3.4*  CL 95* 98  --   CO2 33* 33*  --   GLUCOSE 173* 148*  --   BUN 33* 59*  --   CREATININE 0.81 0.82  --   CALCIUM 8.8* 8.8*  --     ABG: No results for input(s): PHART, PCO2ART, PO2ART, HCO3, O2SAT in the last 168 hours.  Liver Function Tests: No results for input(s): AST, ALT, ALKPHOS, BILITOT, PROT, ALBUMIN in the last 168 hours. No results for input(s): LIPASE, AMYLASE in the last 168 hours. No results for input(s): AMMONIA in the last 168 hours.  CBC: Recent Labs  Lab 06/20/18 0553  WBC 9.8  HGB 11.6*  HCT 36.9*  MCV 100.5*  PLT 176    Cardiac Enzymes: No results for input(s): CKTOTAL, CKMB, CKMBINDEX, TROPONINI in the  last 168 hours.  BNP (last 3 results) No results for input(s): BNP in the last 8760 hours.  ProBNP (last 3 results) No results for input(s): PROBNP in the last 8760 hours.  Radiological Exams: Dg Chest Port 1 View  Result Date: 06/20/2018 CLINICAL DATA:  Respiratory failure, history of pneumonia and lung carcinoma EXAM: PORTABLE CHEST 1 VIEW COMPARISON:  Portable chest x-ray of 06/12/2018 and 06/11/2017 FINDINGS: Aeration has improved considerably and the opacities at the lung bases noted previously have almost completely resolved on this portable chest x-ray in AP projection. Minimally prominent markings remain at the lung bases. Cardiomegaly is stable. Tracheostomy remains. NG tube extends below the hemidiaphragm. IMPRESSION: 1. Significant improvement in aeration of the lung bases with only minimally prominent markings remaining at the lung bases. 2. Stable mild cardiomegaly with tracheostomy remaining. Electronically Signed   By: Ivar Drape M.D.   On: 06/20/2018 09:44    Assessment/Plan Active Problems:   Acute on chronic respiratory failure with hypoxia (HCC)   COPD, very severe (HCC)   Bilateral lower extremity edema   Chronic congestive heart failure (HCC)   Cardiac arrest (HCC)   Bilateral pneumonia   1. Acute on chronic respiratory failure with hypoxia continue with the gradual wean of the FiO2 hopefully will continue to wean on pressure support yesterday did do about 8 hours on  pressure support 2. Severe COPD continue with supportive care 3. Bilateral edema monitor fluid status. 4. Chronic congestive heart failure at baseline 5. Cardiac arrest rhythm has been stable 6. Bilateral pneumonia shows ongoing improvement   I have personally seen and evaluated the patient, evaluated laboratory and imaging results, formulated the assessment and plan and placed orders. The Patient requires high complexity decision making for assessment and support.  Case was discussed on Rounds  with the Respiratory Therapy Staff  Allyne Gee, MD [facility] Pulmonary Critical Care Medicine Sleep Medicine

## 2018-06-22 ENCOUNTER — Other Ambulatory Visit (HOSPITAL_COMMUNITY): Payer: Medicare Other

## 2018-06-22 DIAGNOSIS — R600 Localized edema: Secondary | ICD-10-CM | POA: Diagnosis not present

## 2018-06-22 DIAGNOSIS — I469 Cardiac arrest, cause unspecified: Secondary | ICD-10-CM | POA: Diagnosis not present

## 2018-06-22 DIAGNOSIS — J9621 Acute and chronic respiratory failure with hypoxia: Secondary | ICD-10-CM | POA: Diagnosis not present

## 2018-06-22 DIAGNOSIS — Z930 Tracheostomy status: Secondary | ICD-10-CM

## 2018-06-22 DIAGNOSIS — J189 Pneumonia, unspecified organism: Secondary | ICD-10-CM | POA: Diagnosis not present

## 2018-06-22 LAB — HEMOGLOBIN AND HEMATOCRIT, BLOOD
HEMATOCRIT: 34 % — AB (ref 39.0–52.0)
Hemoglobin: 10.4 g/dL — ABNORMAL LOW (ref 13.0–17.0)

## 2018-06-22 LAB — POTASSIUM: POTASSIUM: 3.6 mmol/L (ref 3.5–5.1)

## 2018-06-22 NOTE — Progress Notes (Addendum)
Pulmonary Critical Care Medicine [place]   PULMONARY CRITICAL CARE SERVICE  PROGRESS NOTE  Date of Service: 06/22/2018  Rick Moyer  [id number]:[id number]  DOB: [birth year]   DOA: 06/11/2018  Referring Physician: Merton Border, MD  HPI: Rick Moyer is a 62 y.o. male seen for follow up of Acute on Chronic Respiratory Failure.  Patient was resting comfortably no distress remains on the ventilator.  Overnight his oxygen requirements have gone up to 70% and also his PEEP was increased to 8.  He has not had a recent chest x-ray or a recent echocardiogram done suspect that there is some pulmonary hypertension issues that might explain his hypoxia  Medications: Reviewed on Rounds  Physical Exam:  Vitals: Temperature 96.5 pulse 69 respiratory rate 23 blood pressure 115/68 saturations 94%  Ventilator Settings mode of ventilation assist control FiO2 70% tidal volume 545 PEEP 8  . General: Comfortable at this time . Eyes: Grossly normal lids, irises & conjunctiva . ENT: grossly tongue is normal . Neck: no obvious mass . Cardiovascular: S1 S2 normal no gallop . Respiratory: Few scattered rhonchi were noted . Abdomen: soft . Skin: no rash seen on limited exam . Musculoskeletal: not rigid . Psychiatric:unable to assess . Neurologic: no seizure no involuntary movements         Lab Data:   Basic Metabolic Panel: Recent Labs  Lab 06/16/18 0618 06/20/18 0553 06/20/18 2259 06/22/18 0607  NA 137 144  --   --   K 3.7 3.2* 3.4* 3.6  CL 95* 98  --   --   CO2 33* 33*  --   --   GLUCOSE 173* 148*  --   --   BUN 33* 59*  --   --   CREATININE 0.81 0.82  --   --   CALCIUM 8.8* 8.8*  --   --     ABG: No results for input(s): PHART, PCO2ART, PO2ART, HCO3, O2SAT in the last 168 hours.  Liver Function Tests: No results for input(s): AST, ALT, ALKPHOS, BILITOT, PROT, ALBUMIN in the last 168 hours. No results for input(s): LIPASE, AMYLASE in the last 168 hours. No  results for input(s): AMMONIA in the last 168 hours.  CBC: Recent Labs  Lab 06/20/18 0553 06/22/18 0607  WBC 9.8  --   HGB 11.6* 10.4*  HCT 36.9* 34.0*  MCV 100.5*  --   PLT 176  --     Cardiac Enzymes: No results for input(s): CKTOTAL, CKMB, CKMBINDEX, TROPONINI in the last 168 hours.  BNP (last 3 results) No results for input(s): BNP in the last 8760 hours.  ProBNP (last 3 results) No results for input(s): PROBNP in the last 8760 hours.  Radiological Exams: Dg Chest Port 1 View  Result Date: 06/22/2018 CLINICAL DATA:  62 y/o M; tracheostomy tube in place. Respiratory failure and history of pneumonia. EXAM: PORTABLE CHEST 1 VIEW COMPARISON:  06/20/2018 chest radiograph. FINDINGS: Stable cardiomegaly given projection and technique. Aortic atherosclerosis with calcification. Stable mild opacities with basilar predominance. Stable tracheostomy tube with tip 6.6 cm above the carina. Enteric tube tip extends below the field of view into the abdomen. No pleural effusion or pneumothorax. No acute osseous abnormality is evident. IMPRESSION: 1. Stable mild pulmonary opacities with basilar predominance. 2. Tracheostomy and enteric tubes are stable. 3. Stable cardiomegaly given projection and technique. Electronically Signed   By: Kristine Garbe M.D.   On: 06/22/2018 06:47    Assessment/Plan Active Problems:   Acute  on chronic respiratory failure with hypoxia (HCC)   COPD, very severe (HCC)   Bilateral lower extremity edema   Chronic congestive heart failure (HCC)   Cardiac arrest (HCC)   Bilateral pneumonia   1. Acute on chronic respiratory failure with hypoxia at this time patient is not weaning.  Patient spontaneous breathing trials have been poor and currently his oxygen requirements are elevated at 70%.  I have asked for a CT angiogram to be done and also an echocardiogram to be done to further investigate why his oxygen requirements are high. 2. Severe COPD we will  continue with present supportive care continue with oxygen therapy as ordered also would consider high-dose steroid taper in case his oxygen requirements do not improve and there is no other cause 3. Bilateral lower extremity edema diuresis as tolerated 4. Cardiac arrest rhythm has been stable 5. Bilateral pneumonia as noted above I am ordering a follow-up CT angiogram of his chest   I have personally seen and evaluated the patient, evaluated laboratory and imaging results, formulated the assessment and plan and placed orders. The Patient requires high complexity decision making for assessment and support.  Case was discussed on Rounds with the Respiratory Therapy Staff total time 35 minutes discussion with the medical staff as well as the review of the chart and ordering of the [place], MD [facility] Pulmonary Critical Care Medicine Sleep Medicine

## 2018-06-23 ENCOUNTER — Other Ambulatory Visit (HOSPITAL_COMMUNITY): Payer: Medicare Other

## 2018-06-23 DIAGNOSIS — J189 Pneumonia, unspecified organism: Secondary | ICD-10-CM | POA: Diagnosis not present

## 2018-06-23 DIAGNOSIS — I469 Cardiac arrest, cause unspecified: Secondary | ICD-10-CM | POA: Diagnosis not present

## 2018-06-23 DIAGNOSIS — R600 Localized edema: Secondary | ICD-10-CM | POA: Diagnosis not present

## 2018-06-23 DIAGNOSIS — J9621 Acute and chronic respiratory failure with hypoxia: Secondary | ICD-10-CM | POA: Diagnosis not present

## 2018-06-23 MED ORDER — IOPAMIDOL (ISOVUE-370) INJECTION 76%
100.0000 mL | Freq: Once | INTRAVENOUS | Status: AC | PRN
  Administered 2018-06-23: 100 mL via INTRAVENOUS

## 2018-06-23 NOTE — Progress Notes (Signed)
Pulmonary Critical Care Medicine [place]   PULMONARY CRITICAL CARE SERVICE  PROGRESS NOTE  Date of Service: 06/23/2018  Rick Moyer  FBP:[id number]  DOB: 26-May-1956   DOA: 06/11/2018  Referring Physician: Merton Border, MD  HPI: Rick Moyer is a 62 y.o. male seen for follow up of Acute on Chronic Respiratory Failure.  Patient has been tolerating the pressure support fairly well.  He has been having some issues with the eating when he is not supposed to and is concerned about whether or not he may have actually aspirated.  Right now is on 50% oxygen.  CT scan and echocardiogram were ordered were awaiting that to be done  Medications: Reviewed on Rounds  Physical Exam:  Vitals: Temperature 97.3 pulse 74 respiratory 28 blood pressure 92/46 saturation 98%  Ventilator Settings mode of ventilation pressure support FiO2 50% tidal volume 778 pressure support 12 PEEP 5  . General: Comfortable at this time . Eyes: Grossly normal lids, irises & conjunctiva . ENT: grossly tongue is normal . Neck: no obvious mass . Cardiovascular: S1 S2 normal no gallop . Respiratory: No rhonchi or rales are noted . Abdomen: soft . Skin: no rash seen on limited exam . Musculoskeletal: not rigid . Psychiatric:unable to assess . Neurologic: no seizure no involuntary movements         Lab Data:   Basic Metabolic Panel: Recent Labs  Lab 06/20/18 0553 06/20/18 2259 06/22/18 0607  NA 144  --   --   K 3.2* 3.4* 3.6  CL 98  --   --   CO2 33*  --   --   GLUCOSE 148*  --   --   BUN 59*  --   --   CREATININE 0.82  --   --   CALCIUM 8.8*  --   --     ABG: No results for input(s): PHART, PCO2ART, PO2ART, HCO3, O2SAT in the last 168 hours.  Liver Function Tests: No results for input(s): AST, ALT, ALKPHOS, BILITOT, PROT, ALBUMIN in the last 168 hours. No results for input(s): LIPASE, AMYLASE in the last 168 hours. No results for input(s): AMMONIA in the last 168  hours.  CBC: Recent Labs  Lab 06/20/18 0553 06/22/18 0607  WBC 9.8  --   HGB 11.6* 10.4*  HCT 36.9* 34.0*  MCV 100.5*  --   PLT 176  --     Cardiac Enzymes: No results for input(s): CKTOTAL, CKMB, CKMBINDEX, TROPONINI in the last 168 hours.  BNP (last 3 results) No results for input(s): BNP in the last 8760 hours.  ProBNP (last 3 results) No results for input(s): PROBNP in the last 8760 hours.  Radiological Exams: Dg Chest Port 1 View  Result Date: 06/22/2018 CLINICAL DATA:  62 y/o M; tracheostomy tube in place. Respiratory failure and history of pneumonia. EXAM: PORTABLE CHEST 1 VIEW COMPARISON:  06/20/2018 chest radiograph. FINDINGS: Stable cardiomegaly given projection and technique. Aortic atherosclerosis with calcification. Stable mild opacities with basilar predominance. Stable tracheostomy tube with tip 6.6 cm above the carina. Enteric tube tip extends below the field of view into the abdomen. No pleural effusion or pneumothorax. No acute osseous abnormality is evident. IMPRESSION: 1. Stable mild pulmonary opacities with basilar predominance. 2. Tracheostomy and enteric tubes are stable. 3. Stable cardiomegaly given projection and technique. Electronically Signed   By: Kristine Garbe M.D.   On: 06/22/2018 06:47    Assessment/Plan Active Problems:   Acute on chronic respiratory failure with  hypoxia (HCC)   COPD, very severe ([place])   Bilateral lower extremity edema   Chronic congestive heart failure ([place])   Cardiac arrest (HCC)   Bilateral pneumonia   1. Acute on chronic respiratory failure with hypoxia we will continue with pressure support wean as tolerated continue with supportive care 2. Severe COPD at baseline we will continue to follow as noted the echocardiogram to assess pulmonary hypertension was ordered.  Also CT scan was ordered to look at the lung parenchyma. 3. Chronic congestive heart failure we will continue with supportive care. 4. Cardiac  arrest rhythm is been stable 5. Bilateral pneumonia follow-up CT scan ordered.   I have personally seen and evaluated the patient, evaluated laboratory and imaging results, formulated the assessment and plan and placed orders. The Patient requires high complexity decision making for assessment and support.  Case was discussed on Rounds with the Respiratory Therapy Staff  Allyne Gee, MD [facility] Pulmonary Critical Care Medicine Sleep Medicine

## 2018-06-24 ENCOUNTER — Other Ambulatory Visit (HOSPITAL_COMMUNITY): Payer: Medicare Other

## 2018-06-24 DIAGNOSIS — R600 Localized edema: Secondary | ICD-10-CM | POA: Diagnosis not present

## 2018-06-24 DIAGNOSIS — J189 Pneumonia, unspecified organism: Secondary | ICD-10-CM | POA: Diagnosis not present

## 2018-06-24 DIAGNOSIS — I469 Cardiac arrest, cause unspecified: Secondary | ICD-10-CM | POA: Diagnosis not present

## 2018-06-24 DIAGNOSIS — J9621 Acute and chronic respiratory failure with hypoxia: Secondary | ICD-10-CM | POA: Diagnosis not present

## 2018-06-24 LAB — CBC
HEMATOCRIT: 33.6 % — AB (ref 39.0–52.0)
Hemoglobin: 10.2 g/dL — ABNORMAL LOW (ref 13.0–17.0)
MCH: 31.1 pg (ref 26.0–34.0)
MCHC: 30.4 g/dL (ref 30.0–36.0)
MCV: 102.4 fL — ABNORMAL HIGH (ref 78.0–100.0)
Platelets: 162 10*3/uL (ref 150–400)
RBC: 3.28 MIL/uL — AB (ref 4.22–5.81)
RDW: 13.7 % (ref 11.5–15.5)
WBC: 11.5 10*3/uL — AB (ref 4.0–10.5)

## 2018-06-24 NOTE — Progress Notes (Signed)
  Echocardiogram 2D Echocardiogram has been performed.  Rick Moyer M 06/24/2018, 3:06 PM

## 2018-06-24 NOTE — Progress Notes (Signed)
Pulmonary Critical Care Medicine [place]   PULMONARY CRITICAL CARE SERVICE  PROGRESS NOTE  Date of Service: 06/24/2018  Rick Moyer  FVC:[id number]  DOB: [birth year]   DOA: 06/11/2018  Referring Physician: Merton Border, MD  HPI: Rick Moyer is a 62 y.o. male seen for follow up of Acute on Chronic Respiratory Failure.  Patient is back on full support and assist control mode is on 70% oxygen at this time.  Medications: Reviewed on Rounds  Physical Exam:  Vitals: Temperature 97.3 pulse 81 respiratory 22 blood pressure 95/63 saturations 97%  Ventilator Settings mode of ventilation assist control FiO2 70% tidal volume 600 PEEP 8  . General: Comfortable at this time . Eyes: Grossly normal lids, irises & conjunctiva . ENT: grossly tongue is normal . Neck: no obvious mass . Cardiovascular: S1 S2 normal no gallop . Respiratory: Coarse rhonchi are noted . Abdomen: soft . Skin: no rash seen on limited exam . Musculoskeletal: not rigid . Psychiatric:unable to assess . Neurologic: no seizure no involuntary movements         Lab Data:   Basic Metabolic Panel: Recent Labs  Lab 06/20/18 0553 06/20/18 2259 06/22/18 0607  NA 144  --   --   K 3.2* 3.4* 3.6  CL 98  --   --   CO2 33*  --   --   GLUCOSE 148*  --   --   BUN 59*  --   --   CREATININE 0.82  --   --   CALCIUM 8.8*  --   --     ABG: No results for input(s): PHART, PCO2ART, PO2ART, HCO3, O2SAT in the last 168 hours.  Liver Function Tests: No results for input(s): AST, ALT, ALKPHOS, BILITOT, PROT, ALBUMIN in the last 168 hours. No results for input(s): LIPASE, AMYLASE in the last 168 hours. No results for input(s): AMMONIA in the last 168 hours.  CBC: Recent Labs  Lab 06/20/18 0553 06/22/18 0607 06/24/18 0630  WBC 9.8  --  11.5*  HGB 11.6* 10.4* 10.2*  HCT 36.9* 34.0* 33.6*  MCV 100.5*  --  102.4*  PLT 176  --  162    Cardiac Enzymes: No results for input(s): CKTOTAL, CKMB,  CKMBINDEX, TROPONINI in the last 168 hours.  BNP (last 3 results) No results for input(s): BNP in the last 8760 hours.  ProBNP (last 3 results) No results for input(s): PROBNP in the last 8760 hours.  Radiological Exams: Ct Angio Chest Pe W Or Wo Contrast  Result Date: 06/23/2018 CLINICAL DATA:  Hypoxia, interstitial lung disease EXAM: CT ANGIOGRAPHY CHEST WITH CONTRAST TECHNIQUE: Multidetector CT imaging of the chest was performed using the standard protocol during bolus administration of intravenous contrast. Multiplanar CT image reconstructions and MIPs were obtained to evaluate the vascular anatomy. CONTRAST:  142mL ISOVUE-370 IOPAMIDOL (ISOVUE-370) INJECTION 76% COMPARISON:  None. FINDINGS: Cardiovascular: Four-chamber cardiac enlargement. Trace pericardial effusion. Dilatation of central pulmonary arteries. Satisfactory opacification of pulmonary arteries noted, and there is no evidence of pulmonary emboli. Scattered coronary calcifications and aortic leaflet calcifications. Ascending aorta is dilated to 4.9 cm diameter, proximal arch 3.9 cm. Fair aortic opacification with no suggestion of dissection or stenosis. Classic 3 vessel brachiocephalic arterial origin anatomy. Atheromatous plaque in the visualized proximal abdominal aorta without aneurysm. Mediastinum/Nodes: No hilar or mediastinal adenopathy. Tracheostomy device projects in expected location. Lungs/Pleura: No pleural effusion. No pneumothorax. 2.2 cm spiculated pleural based lesion in the posterior apical right upper lobe. Pulmonary emphysema.  2.4 cm semi-solid lesion in the superior segment right lower lobe. Some linear scarring in the left lower lobe. Left lung otherwise clear. Upper Abdomen: Enteric tube extends to the proximal duodenum. 5 mm upper pole left renal calculus. No acute findings. Musculoskeletal: Mild compression deformity of T9. Mild superior endplate compression deformity of L1. Spondylitic changes in the visualized  lower cervical spine. Healing bilateral rib and sternal fractures. Review of the MIP images confirms the above findings. IMPRESSION: 1. Negative for acute PE. 2. 2.2 right upper lobe and 2.4 cm right lower lobe lung lesions. Consultation with pulmonary medicine or thoracic surgery suggested, as clinically appropriate. 3. Coronary and Aortic Atherosclerosis (ICD10-170.0). 4. 4.9 cm ascending thoracic aortic aneurysm NOS (ICD10-I71.9). Ascending thoracic aortic aneurysm. Recommend semi-annual imaging followup by CTA or MRA and referral to cardiothoracic surgery if not already obtained. This recommendation follows 2010 ACCF/AHA/AATS/ACR/ASA/SCA/SCAI/SIR/STS/SVM Guidelines for the Diagnosis and Management of Patients With Thoracic Aortic Disease. Circulation. 2010; 121: e266-e369 5. Compression deformities of T9 and L1, age indeterminate. 6. Left nephrolithiasis Electronically Signed   By: Lucrezia Europe M.D.   On: 06/23/2018 16:56    Assessment/Plan Active Problems:   Acute on chronic respiratory failure with hypoxia (HCC)   COPD, very severe (HCC)   Bilateral lower extremity edema   Chronic congestive heart failure (HCC)   Cardiac arrest (HCC)   Bilateral pneumonia   1. Acute on chronic respiratory failure with hypoxia patient continues to have significant dependence on the ventilator.  Oxygen requirements are still elevated at 70%.  Patient had a CT scan done which had shown to pulmonary nodules present in the right lung will need to see if these are new or if this is old.  Patient also has significant aortic aneurysm and also coronary artery disease.  Although this may not completely explain his failure to wean there may be some cardiac issues that may be going on. 2. Severe COPD advanced disease we will continue with oxygen therapy and try to titrate as tolerated.  Patient's oxygen requirements are still not improving. 3. Chronic congestive heart failure echocardiogram was done today we will await the  results of the echo. 4. Cardiac arrest rhythm has been stable we will continue present management 5. Bilateral pneumonia based on the CT he has pulmonary nodules but other than that unremarkable.   I have personally seen and evaluated the patient, evaluated laboratory and imaging results, formulated the assessment and plan and placed orders. The Patient requires high complexity decision making for assessment and support.  Case was discussed on Rounds with the Respiratory Therapy Staff time spent 35 minutes review of the radiological studies and discussion with staff on multidisciplinary rounds  Allyne Gee, MD [facility] Pulmonary Critical Care Medicine Sleep Medicine

## 2018-06-25 DIAGNOSIS — J189 Pneumonia, unspecified organism: Secondary | ICD-10-CM | POA: Diagnosis not present

## 2018-06-25 DIAGNOSIS — R600 Localized edema: Secondary | ICD-10-CM | POA: Diagnosis not present

## 2018-06-25 DIAGNOSIS — J9621 Acute and chronic respiratory failure with hypoxia: Secondary | ICD-10-CM | POA: Diagnosis not present

## 2018-06-25 DIAGNOSIS — I469 Cardiac arrest, cause unspecified: Secondary | ICD-10-CM | POA: Diagnosis not present

## 2018-06-25 NOTE — Progress Notes (Signed)
Pulmonary Critical Care Medicine [place]   PULMONARY CRITICAL CARE SERVICE  PROGRESS NOTE  Date of Service: 06/25/2018  Rick Moyer  LFY:[id number]  DOB: [birth year]   DOA: 06/11/2018  Referring Physician: Merton Border, MD  HPI: Rick Moyer is a 62 y.o. male seen for follow up of Acute on Chronic Respiratory Failure.  Patient continues to have difficulty weaning he is on 60% oxygen.  Had an echocardiogram done which showed mild pulmonary hypertension.  I think his issue with the weaning is probably related to combined cardiopulmonary disease.  Medications: Reviewed on Rounds  Physical Exam:  Vitals: Temperature 98.0 pulse 87 respiratory rate 24 blood pressure 126/74 saturations are 95%  Ventilator Settings mode of ventilation assist control FiO2 60% tidal volume 440 PEEP 8  . General: Comfortable at this time . Eyes: Grossly normal lids, irises & conjunctiva . ENT: grossly tongue is normal . Neck: no obvious mass . Cardiovascular: S1 S2 normal no gallop . Respiratory: No rhonchi no rales are noted at this time . Abdomen: soft . Skin: no rash seen on limited exam . Musculoskeletal: not rigid . Psychiatric:unable to assess . Neurologic: no seizure no involuntary movements         Lab Data:   Basic Metabolic Panel: Recent Labs  Lab 06/20/18 0553 06/20/18 2259 06/22/18 0607  NA 144  --   --   K 3.2* 3.4* 3.6  CL 98  --   --   CO2 33*  --   --   GLUCOSE 148*  --   --   BUN 59*  --   --   CREATININE 0.82  --   --   CALCIUM 8.8*  --   --     ABG: No results for input(s): PHART, PCO2ART, PO2ART, HCO3, O2SAT in the last 168 hours.  Liver Function Tests: No results for input(s): AST, ALT, ALKPHOS, BILITOT, PROT, ALBUMIN in the last 168 hours. No results for input(s): LIPASE, AMYLASE in the last 168 hours. No results for input(s): AMMONIA in the last 168 hours.  CBC: Recent Labs  Lab 06/20/18 0553 06/22/18 0607 06/24/18 0630  WBC  9.8  --  11.5*  HGB 11.6* 10.4* 10.2*  HCT 36.9* 34.0* 33.6*  MCV 100.5*  --  102.4*  PLT 176  --  162    Cardiac Enzymes: No results for input(s): CKTOTAL, CKMB, CKMBINDEX, TROPONINI in the last 168 hours.  BNP (last 3 results) No results for input(s): BNP in the last 8760 hours.  ProBNP (last 3 results) No results for input(s): PROBNP in the last 8760 hours.  Radiological Exams: Ct Angio Chest Pe W Or Wo Contrast  Result Date: 06/23/2018 CLINICAL DATA:  Hypoxia, interstitial lung disease EXAM: CT ANGIOGRAPHY CHEST WITH CONTRAST TECHNIQUE: Multidetector CT imaging of the chest was performed using the standard protocol during bolus administration of intravenous contrast. Multiplanar CT image reconstructions and MIPs were obtained to evaluate the vascular anatomy. CONTRAST:  193mL ISOVUE-370 IOPAMIDOL (ISOVUE-370) INJECTION 76% COMPARISON:  None. FINDINGS: Cardiovascular: Four-chamber cardiac enlargement. Trace pericardial effusion. Dilatation of central pulmonary arteries. Satisfactory opacification of pulmonary arteries noted, and there is no evidence of pulmonary emboli. Scattered coronary calcifications and aortic leaflet calcifications. Ascending aorta is dilated to 4.9 cm diameter, proximal arch 3.9 cm. Fair aortic opacification with no suggestion of dissection or stenosis. Classic 3 vessel brachiocephalic arterial origin anatomy. Atheromatous plaque in the visualized proximal abdominal aorta without aneurysm. Mediastinum/Nodes: No hilar or mediastinal adenopathy. Tracheostomy  device projects in expected location. Lungs/Pleura: No pleural effusion. No pneumothorax. 2.2 cm spiculated pleural based lesion in the posterior apical right upper lobe. Pulmonary emphysema. 2.4 cm semi-solid lesion in the superior segment right lower lobe. Some linear scarring in the left lower lobe. Left lung otherwise clear. Upper Abdomen: Enteric tube extends to the proximal duodenum. 5 mm upper pole left renal  calculus. No acute findings. Musculoskeletal: Mild compression deformity of T9. Mild superior endplate compression deformity of L1. Spondylitic changes in the visualized lower cervical spine. Healing bilateral rib and sternal fractures. Review of the MIP images confirms the above findings. IMPRESSION: 1. Negative for acute PE. 2. 2.2 right upper lobe and 2.4 cm right lower lobe lung lesions. Consultation with pulmonary medicine or thoracic surgery suggested, as clinically appropriate. 3. Coronary and Aortic Atherosclerosis (ICD10-170.0). 4. 4.9 cm ascending thoracic aortic aneurysm NOS (ICD10-I71.9). Ascending thoracic aortic aneurysm. Recommend semi-annual imaging followup by CTA or MRA and referral to cardiothoracic surgery if not already obtained. This recommendation follows 2010 ACCF/AHA/AATS/ACR/ASA/SCA/SCAI/SIR/STS/SVM Guidelines for the Diagnosis and Management of Patients With Thoracic Aortic Disease. Circulation. 2010; 121: e266-e369 5. Compression deformities of T9 and L1, age indeterminate. 6. Left nephrolithiasis Electronically Signed   By: Lucrezia Europe M.D.   On: 06/23/2018 16:56    Assessment/Plan Active Problems:   Acute on chronic respiratory failure with hypoxia (HCC)   COPD, very severe (HCC)   Bilateral lower extremity edema   Chronic congestive heart failure (HCC)   Cardiac arrest (HCC)   Bilateral pneumonia   1. Acute on chronic respiratory failure with hypoxia we will continue with full vent support try to titrate oxygen down as tolerated. 2. Pulmonary arterial hypertension mild disease may be contributing to his failure to wean he probably has no reserve and asked why he is still requiring significantly abated amounts of oxygen. 3. Chronic congestive heart failure diuresis as tolerated.  Continue with supportive care. 4. Cardiac arrest stable rhythm. 5. Bilateral pneumonia treated with antibiotics. 6. Pulmonary nodules will need follow-up after discharge   I have personally  seen and evaluated the patient, evaluated laboratory and imaging results, formulated the assessment and plan and placed orders. The Patient requires high complexity decision making for assessment and support.  Case was discussed on Rounds with the Respiratory Therapy Staff  Allyne Gee, MD [facility] Pulmonary Critical Care Medicine Sleep Medicine

## 2018-06-26 DIAGNOSIS — J189 Pneumonia, unspecified organism: Secondary | ICD-10-CM | POA: Diagnosis not present

## 2018-06-26 DIAGNOSIS — R600 Localized edema: Secondary | ICD-10-CM | POA: Diagnosis not present

## 2018-06-26 DIAGNOSIS — J9621 Acute and chronic respiratory failure with hypoxia: Secondary | ICD-10-CM | POA: Diagnosis not present

## 2018-06-26 DIAGNOSIS — I469 Cardiac arrest, cause unspecified: Secondary | ICD-10-CM | POA: Diagnosis not present

## 2018-06-26 NOTE — Progress Notes (Addendum)
Pulmonary Critical Care Medicine [place]   PULMONARY CRITICAL CARE SERVICE  PROGRESS NOTE  Date of Service: 06/26/2018  Rick Moyer  ZDG:[id number]  DOB: 1956-09-02   DOA: 06/11/2018  Referring Physician: Merton Border, MD  HPI: Rick Moyer is a 62 y.o. male seen for follow up of Acute on Chronic Respiratory Failure.  Patient is still on full support on assist control mode has been on 60% oxygen.  PEEP requirements are 8.  I spoke with cardiology to reassess his cardiac status.  I personally reviewed the CT scan and there does not appear to be enough parenchymal disease to explain the degree of hypoxia he has been exhibiting.  I would therefore venture to guess that he may have some cardiopulmonary issues together versus chest pulmonary issues.  Medications: Reviewed on Rounds  Physical Exam:  Vitals: Temperature 98.0 pulse 74 respiratory rate 22 blood pressure 103/57 saturations 93%  Ventilator Settings mode of ventilation assist control FiO2 60% PEEP 8 tidal volume 600  . General: Comfortable at this time . Eyes: Grossly normal lids, irises & conjunctiva . ENT: grossly tongue is normal . Neck: no obvious mass . Cardiovascular: S1 S2 normal no gallop . Respiratory: No rhonchi or rales are noted at this time. . Abdomen: soft . Skin: no rash seen on limited exam . Musculoskeletal: not rigid . Psychiatric:unable to assess . Neurologic: no seizure no involuntary movements         Lab Data:   Basic Metabolic Panel: Recent Labs  Lab 06/20/18 0553 06/20/18 2259 06/22/18 0607  NA 144  --   --   K 3.2* 3.4* 3.6  CL 98  --   --   CO2 33*  --   --   GLUCOSE 148*  --   --   BUN 59*  --   --   CREATININE 0.82  --   --   CALCIUM 8.8*  --   --     ABG: No results for input(s): PHART, PCO2ART, PO2ART, HCO3, O2SAT in the last 168 hours.  Liver Function Tests: No results for input(s): AST, ALT, ALKPHOS, BILITOT, PROT, ALBUMIN in the last 168  hours. No results for input(s): LIPASE, AMYLASE in the last 168 hours. No results for input(s): AMMONIA in the last 168 hours.  CBC: Recent Labs  Lab 06/20/18 0553 06/22/18 0607 06/24/18 0630  WBC 9.8  --  11.5*  HGB 11.6* 10.4* 10.2*  HCT 36.9* 34.0* 33.6*  MCV 100.5*  --  102.4*  PLT 176  --  162    Cardiac Enzymes: No results for input(s): CKTOTAL, CKMB, CKMBINDEX, TROPONINI in the last 168 hours.  BNP (last 3 results) No results for input(s): BNP in the last 8760 hours.  ProBNP (last 3 results) No results for input(s): PROBNP in the last 8760 hours.  Radiological Exams: No results found.  Assessment/Plan Active Problems:   Acute on chronic respiratory failure with hypoxia (HCC)   COPD, very severe (HCC)   Bilateral lower extremity edema   Chronic congestive heart failure (HCC)   Cardiac arrest (HCC)   Bilateral pneumonia   1. Acute on chronic respiratory failure with hypoxia we will continue with full support assess the spontaneous breathing trials daily try to wean as tolerated. 2. Severe COPD we will continue with supportive care pulmonary toilet 3. Bilateral edema diuresis tolerated. 4. Chronic congestive heart failure at this time spoke with cardiology to reassess his cardiac status.  Suspect he has some  underlying coronary issues going on to explain the degree of hypoxia. 5. Status post cardiac arrest right now his rhythm has been stable we will continue present management 6. Bilateral pneumonia clinically improving and radiologically actually improving we will continue with supportive care at this time.   I have personally seen and evaluated the patient, evaluated laboratory and imaging results, formulated the assessment and plan and placed orders. The Patient requires high complexity decision making for assessment and support.  Case was discussed on Rounds with the Respiratory Therapy Staff time spent 35 minutes discussion with cardiology as well as review  of the medical record and discussion with primary care team  Allyne Gee, MD [facility] Pulmonary Critical Care Medicine Sleep Medicine

## 2018-06-27 ENCOUNTER — Other Ambulatory Visit (HOSPITAL_COMMUNITY): Payer: Medicare Other

## 2018-06-27 DIAGNOSIS — R600 Localized edema: Secondary | ICD-10-CM | POA: Diagnosis not present

## 2018-06-27 DIAGNOSIS — J9621 Acute and chronic respiratory failure with hypoxia: Secondary | ICD-10-CM | POA: Diagnosis not present

## 2018-06-27 DIAGNOSIS — J189 Pneumonia, unspecified organism: Secondary | ICD-10-CM | POA: Diagnosis not present

## 2018-06-27 DIAGNOSIS — I469 Cardiac arrest, cause unspecified: Secondary | ICD-10-CM | POA: Diagnosis not present

## 2018-06-27 LAB — CBC WITH DIFFERENTIAL/PLATELET
Abs Immature Granulocytes: 0.1 10*3/uL (ref 0.0–0.1)
Basophils Absolute: 0 10*3/uL (ref 0.0–0.1)
Basophils Relative: 0 %
EOS ABS: 0.1 10*3/uL (ref 0.0–0.7)
Eosinophils Relative: 1 %
HEMATOCRIT: 29.9 % — AB (ref 39.0–52.0)
HEMOGLOBIN: 9 g/dL — AB (ref 13.0–17.0)
IMMATURE GRANULOCYTES: 1 %
LYMPHS ABS: 0.4 10*3/uL — AB (ref 0.7–4.0)
LYMPHS PCT: 4 %
MCH: 31.1 pg (ref 26.0–34.0)
MCHC: 30.1 g/dL (ref 30.0–36.0)
MCV: 103.5 fL — AB (ref 78.0–100.0)
MONOS PCT: 7 %
Monocytes Absolute: 0.7 10*3/uL (ref 0.1–1.0)
Neutro Abs: 9.6 10*3/uL — ABNORMAL HIGH (ref 1.7–7.7)
Neutrophils Relative %: 87 %
Platelets: 111 10*3/uL — ABNORMAL LOW (ref 150–400)
RBC: 2.89 MIL/uL — ABNORMAL LOW (ref 4.22–5.81)
RDW: 14 % (ref 11.5–15.5)
WBC: 11 10*3/uL — ABNORMAL HIGH (ref 4.0–10.5)

## 2018-06-27 NOTE — Progress Notes (Signed)
Pulmonary Critical Care Medicine [place]   PULMONARY CRITICAL CARE SERVICE  PROGRESS NOTE  Date of Service: 06/27/2018  Rick Moyer  SWN:[id number]  DOB: [birth year]   DOA: 06/11/2018  Referring Physician: Merton Border, MD  HPI: Rick Moyer is a 62 y.o. male seen for follow up of Acute on Chronic Respiratory Failure.  Patient is grossly unchanged he is down to 50% oxygen and was started on a wean on pressure support 12/5.  Continues to ask for pain medications.  I spoke with his sister-in-law who lives in [place].  She explained to me that he has a long-standing drug-seeking behavior.  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  She stated that she looked into his apartment and had found multiple vials of oxycodone.  The patient still has significant hypoxia we reviewed the CT scan the only findings on the CT of the chest or nodules as previously noted otherwise the parenchyma looked fairly clean except for the lower lobe scarring.  In addition he does have a history of hepatitis and alcohol abuse.  This could certainly be arteriovenous malformations which are causing his hypoxia I will discuss further with the primary care team regarding investigating this further.  In addition I think he needs to also have a follow-up HIV test on.  Medications: Reviewed on Rounds  Physical Exam:  Vitals: Temperature 98.0 pulse 69 respiratory rate 18 blood pressure 133/66 saturations 96%  Ventilator Settings mode of ventilation is pressure support FiO2 50% tidal volume 600 pressure support 12 PEEP 5  . General: Comfortable at this time . Eyes: Grossly normal lids, irises & conjunctiva . ENT: grossly tongue is normal . Neck: no obvious mass . Cardiovascular: S1 S2 normal no gallop . Respiratory: No rhonchi or rales are noted . Abdomen: soft . Skin: no rash seen on limited exam . Musculoskeletal: not rigid . Psychiatric:unable to assess . Neurologic: no seizure no involuntary  movements         Lab Data:   Basic Metabolic Panel: Recent Labs  Lab 06/20/18 2259 06/22/18 0607  K 3.4* 3.6    ABG: No results for input(s): PHART, PCO2ART, PO2ART, HCO3, O2SAT in the last 168 hours.  Liver Function Tests: No results for input(s): AST, ALT, ALKPHOS, BILITOT, PROT, ALBUMIN in the last 168 hours. No results for input(s): LIPASE, AMYLASE in the last 168 hours. No results for input(s): AMMONIA in the last 168 hours.  CBC: Recent Labs  Lab 06/22/18 0607 06/24/18 0630 06/27/18 1017  WBC  --  11.5* 11.0*  NEUTROABS  --   --  9.6*  HGB 10.4* 10.2* 9.0*  HCT 34.0* 33.6* 29.9*  MCV  --  102.4* 103.5*  PLT  --  162 111*    Cardiac Enzymes: No results for input(s): CKTOTAL, CKMB, CKMBINDEX, TROPONINI in the last 168 hours.  BNP (last 3 results) No results for input(s): BNP in the last 8760 hours.  ProBNP (last 3 results) No results for input(s): PROBNP in the last 8760 hours.  Radiological Exams: Dg Chest Port 1 View  Result Date: 06/27/2018 CLINICAL DATA:  Chest pain.  History of pneumothorax. EXAM: PORTABLE CHEST 1 VIEW COMPARISON:  CT 06/23/2018.  Chest x-ray 06/22/2018. FINDINGS: Tracheostomy tube noted with its tip 6 cm above the carina. NG tube noted with its tip below left hemidiaphragm. Cardiomegaly with normal pulmonary vascularity. Left base atelectasis/infiltrate. No pleural effusion or pneumothorax. IMPRESSION: 1.  Tracheostomy tube and NG tube noted good anatomic position.  2.  Cardiomegaly with normal pulmonary vascularity. 3.  Left base atelectasis/infiltrate. Electronically Signed   By: Marcello Moores  Register   On: 06/27/2018 11:47    Assessment/Plan Active Problems:   Acute on chronic respiratory failure with hypoxia (HCC)   COPD, very severe (HCC)   Bilateral lower extremity edema   Chronic congestive heart failure ([place])   Cardiac arrest (HCC)   Bilateral pneumonia   1. Acute on chronic respiratory failure with hypoxia we will continue  with trying to wean on pressure support continue pulmonary toilet secretion management.  Patient still has significant hypoxia noted requiring 50% oxygen.  The explanation for the hypoxia is unclear 2. Severe COPD we will continue with supportive care and current management. 3. Bilateral edema continue with supportive care patient had an echocardiogram done which showed normal ventricles however he did have significant pulmonary hypertension noted on the echocardiogram. 4. Chronic congestive heart failure as above had normal preserved EF but has got pulmonary hypertension suspect right-sided involvement. 5. Status post cardiac arrest rhythm has been stable while he has been here. 6. Bilateral pneumonia has been treated we will continue with supportive care 7. History of heroin abuse I am going to ask for an HIV test to be ordered as well as his history of hepatitis to further investigate the liver possibility of liver disease should be ruled out   I have personally seen and evaluated the patient, evaluated laboratory and imaging results, formulated the assessment and plan and placed orders. The Patient requires high complexity decision making for assessment and support.  Case was discussed on Rounds with the Respiratory Therapy Staff time spent 35 minutes discussion with the family as well as the patient and primary care team  Allyne Gee, MD [facility] Pulmonary Critical Care Medicine Sleep Medicine

## 2018-06-28 ENCOUNTER — Other Ambulatory Visit (HOSPITAL_COMMUNITY): Payer: Medicare Other

## 2018-06-28 DIAGNOSIS — J189 Pneumonia, unspecified organism: Secondary | ICD-10-CM | POA: Diagnosis not present

## 2018-06-28 DIAGNOSIS — I469 Cardiac arrest, cause unspecified: Secondary | ICD-10-CM | POA: Diagnosis not present

## 2018-06-28 DIAGNOSIS — R600 Localized edema: Secondary | ICD-10-CM | POA: Diagnosis not present

## 2018-06-28 DIAGNOSIS — J9621 Acute and chronic respiratory failure with hypoxia: Secondary | ICD-10-CM | POA: Diagnosis not present

## 2018-06-28 LAB — BASIC METABOLIC PANEL
ANION GAP: 7 (ref 5–15)
BUN: 38 mg/dL — ABNORMAL HIGH (ref 8–23)
CALCIUM: 8 mg/dL — AB (ref 8.9–10.3)
CHLORIDE: 106 mmol/L (ref 98–111)
CO2: 35 mmol/L — AB (ref 22–32)
Creatinine, Ser: 0.83 mg/dL (ref 0.61–1.24)
GFR calc non Af Amer: >60 mL/min (ref >60)
Glucose, Bld: 128 mg/dL — ABNORMAL HIGH (ref 70–99)
POTASSIUM: 3 mmol/L — AB (ref 3.5–5.1)
Sodium: 148 mmol/L — ABNORMAL HIGH (ref 135–145)

## 2018-06-28 LAB — VANCOMYCIN, TROUGH: Vancomycin Tr: 23 ug/mL (ref 15–20)

## 2018-06-28 NOTE — Progress Notes (Signed)
Pulmonary Critical Care Medicine [place]   PULMONARY CRITICAL CARE SERVICE  PROGRESS NOTE  Date of Service: 06/28/2018  Rick Moyer  [id number]:[id number]  DOB: 16-Dec-1955   DOA: 06/11/2018  Referring Physician: Merton Border, MD  HPI: Rick Moyer is a 62 y.o. male seen for follow up of Acute on Chronic Respiratory Failure.  Patient is on pressure support mode currently is on 60% oxygen.  He is on pressure support 12 PEEP 5.  Continues to have high oxygen requirements  Medications: Reviewed on Rounds  Physical Exam:  Vitals: Temperature 97.5 pulse 76 respiratory rate 20 blood pressure is 103/54 saturations are 90%  Ventilator Settings mode of ventilation pressure support FiO2 60% tidal volume 710 pressure support 12 PEEP 5  . General: Comfortable at this time . Eyes: Grossly normal lids, irises & conjunctiva . ENT: grossly tongue is normal . Neck: no obvious mass . Cardiovascular: S1 S2 normal no gallop . Respiratory: No rhonchi or rales are noted . Abdomen: soft . Skin: no rash seen on limited exam . Musculoskeletal: not rigid . Psychiatric:unable to assess . Neurologic: no seizure no involuntary movements         Lab Data:   Basic Metabolic Panel: Recent Labs  Lab 06/22/18 0607 06/28/18 0552  NA  --  148*  K 3.6 3.0*  CL  --  106  CO2  --  35*  GLUCOSE  --  128*  BUN  --  38*  CREATININE  --  0.83  CALCIUM  --  8.0*    ABG: No results for input(s): PHART, PCO2ART, PO2ART, HCO3, O2SAT in the last 168 hours.  Liver Function Tests: No results for input(s): AST, ALT, ALKPHOS, BILITOT, PROT, ALBUMIN in the last 168 hours. No results for input(s): LIPASE, AMYLASE in the last 168 hours. No results for input(s): AMMONIA in the last 168 hours.  CBC: Recent Labs  Lab 06/22/18 0607 06/24/18 0630 06/27/18 1017  WBC  --  11.5* 11.0*  NEUTROABS  --   --  9.6*  HGB 10.4* 10.2* 9.0*  HCT 34.0* 33.6* 29.9*  MCV  --  102.4* 103.5*  PLT   --  162 111*    Cardiac Enzymes: No results for input(s): CKTOTAL, CKMB, CKMBINDEX, TROPONINI in the last 168 hours.  BNP (last 3 results) No results for input(s): BNP in the last 8760 hours.  ProBNP (last 3 results) No results for input(s): PROBNP in the last 8760 hours.  Radiological Exams: Dg Chest Port 1 View  Result Date: 06/27/2018 CLINICAL DATA:  Chest pain.  History of pneumothorax. EXAM: PORTABLE CHEST 1 VIEW COMPARISON:  CT 06/23/2018.  Chest x-ray 06/22/2018. FINDINGS: Tracheostomy tube noted with its tip 6 cm above the carina. NG tube noted with its tip below left hemidiaphragm. Cardiomegaly with normal pulmonary vascularity. Left base atelectasis/infiltrate. No pleural effusion or pneumothorax. IMPRESSION: 1.  Tracheostomy tube and NG tube noted good anatomic position. 2.  Cardiomegaly with normal pulmonary vascularity. 3.  Left base atelectasis/infiltrate. Electronically Signed   By: Marcello Moores  Register   On: 06/27/2018 11:47    Assessment/Plan Active Problems:   Acute on chronic respiratory failure with hypoxia (HCC)   COPD, very severe (HCC)   Bilateral lower extremity edema   Chronic congestive heart failure ([place])   Cardiac arrest (HCC)   Bilateral pneumonia   1. Acute on chronic respiratory failure with hypoxia patient will continue with pressure support wean as tolerated.  Try to titrate his oxygen  as tolerated. 2. Severe COPD at baseline we will continue present management 3. Chronic congestive heart failure patient has significant edema still cardiology is following the patient. 4. Status post cardiac arrest rhythm is stable 5. Bilateral pneumonia treated 6. Chronic pain syndrome patient has chronic pain complaints looks comfortable.  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   I have personally seen and evaluated the patient, evaluated  laboratory and imaging results, formulated the assessment and plan and placed orders. The Patient requires high complexity decision making for assessment and support.  Case was discussed on Rounds with the Respiratory Therapy Staff  Allyne Gee, MD [facility] Pulmonary Critical Care Medicine Sleep Medicine

## 2018-06-29 ENCOUNTER — Other Ambulatory Visit (HOSPITAL_COMMUNITY): Payer: Medicare Other

## 2018-06-29 DIAGNOSIS — J189 Pneumonia, unspecified organism: Secondary | ICD-10-CM | POA: Diagnosis not present

## 2018-06-29 DIAGNOSIS — I469 Cardiac arrest, cause unspecified: Secondary | ICD-10-CM | POA: Diagnosis not present

## 2018-06-29 DIAGNOSIS — J9621 Acute and chronic respiratory failure with hypoxia: Secondary | ICD-10-CM | POA: Diagnosis not present

## 2018-06-29 DIAGNOSIS — R600 Localized edema: Secondary | ICD-10-CM | POA: Diagnosis not present

## 2018-06-29 LAB — VANCOMYCIN, TROUGH: Vancomycin Tr: 16 ug/mL (ref 15–20)

## 2018-06-29 LAB — CULTURE, RESPIRATORY W GRAM STAIN

## 2018-06-29 LAB — POTASSIUM: Potassium: 3.4 mmol/L — ABNORMAL LOW (ref 3.5–5.1)

## 2018-06-29 LAB — CULTURE, RESPIRATORY

## 2018-06-29 NOTE — Progress Notes (Signed)
Pulmonary Critical Care Medicine [place]   PULMONARY CRITICAL CARE SERVICE  PROGRESS NOTE  Date of Service: 06/29/2018  Rick Moyer  WPY:[id number]  DOB: [birth year]   DOA: 06/11/2018  Referring Physician: Merton Border, MD  HPI: Rick Moyer is a 62 y.o. male seen for follow up of Acute on Chronic Respiratory Failure.  The patient is weaning today currently is on pressure support on 50% oxygen with a pressure support of 12 PEEP 5  Medications: Reviewed on Rounds  Physical Exam:  Vitals: Temperature 97.4 pulse 62 respiratory rate 24 blood pressure 90/52 saturations 93%  Ventilator Settings mode of ventilation pressure support FiO2 50% tidal volume 400 pressure support 12 PEEP 5  . General: Comfortable at this time . Eyes: Grossly normal lids, irises & conjunctiva . ENT: grossly tongue is normal . Neck: no obvious mass . Cardiovascular: S1 S2 normal no gallop . Respiratory: No rhonchi or rales are noted at this time. . Abdomen: soft . Skin: no rash seen on limited exam . Musculoskeletal: not rigid . Psychiatric:unable to assess . Neurologic: no seizure no involuntary movements         Lab Data:   Basic Metabolic Panel: Recent Labs  Lab 06/28/18 0552 06/29/18 0652  NA 148*  --   K 3.0* 3.4*  CL 106  --   CO2 35*  --   GLUCOSE 128*  --   BUN 38*  --   CREATININE 0.83  --   CALCIUM 8.0*  --     ABG: No results for input(s): PHART, PCO2ART, PO2ART, HCO3, O2SAT in the last 168 hours.  Liver Function Tests: No results for input(s): AST, ALT, ALKPHOS, BILITOT, PROT, ALBUMIN in the last 168 hours. No results for input(s): LIPASE, AMYLASE in the last 168 hours. No results for input(s): AMMONIA in the last 168 hours.  CBC: Recent Labs  Lab 06/24/18 0630 06/27/18 1017  WBC 11.5* 11.0*  NEUTROABS  --  9.6*  HGB 10.2* 9.0*  HCT 33.6* 29.9*  MCV 102.4* 103.5*  PLT 162 111*    Cardiac Enzymes: No results for input(s): CKTOTAL,  CKMB, CKMBINDEX, TROPONINI in the last 168 hours.  BNP (last 3 results) No results for input(s): BNP in the last 8760 hours.  ProBNP (last 3 results) No results for input(s): PROBNP in the last 8760 hours.  Radiological Exams: US Abdomen Limited Ruq  Result Date: 06/29/2018 CLINICAL DATA:  Liver disease, on vent EXAM: ULTRASOUND ABDOMEN LIMITED RIGHT UPPER QUADRANT COMPARISON:  None. FINDINGS: Gallbladder: Layering gallbladder sludge. No gallbladder wall thickening or pericholecystic fluid. Common bile duct: Diameter: 4 mm Liver: Hyperechoic hepatic parenchyma. No focal hepatic lesion is seen. Portal vein is patent on color Doppler imaging with normal direction of blood flow towards the liver. IMPRESSION: Hyperechoic hepatic parenchyma, nonspecific but raising the possibility of hepatic steatosis. No focal hepatic lesion is seen. Layering gallbladder sludge, without associated inflammatory changes. Electronically Signed   By: Julian Hy M.D.   On: 06/29/2018 02:45    Assessment/Plan Active Problems:   Acute on chronic respiratory failure with hypoxia (HCC)   COPD, very severe (HCC)   Bilateral lower extremity edema   Chronic congestive heart failure (HCC)   Cardiac arrest (HCC)   Bilateral pneumonia   1. Acute on chronic history failure with hypoxia we will continue with the weaning on pressure support as tolerated 2. Severe COPD at present continue with present management 3. Lateral lower extremity edema diuresis as tolerated 4.  Chronic congestive heart failure at baseline we will continue supportive care 5. Bilateral pneumonia treated we will follow 6. Status post cardiac arrest at baseline continue with present management   I have personally seen and evaluated the patient, evaluated laboratory and imaging results, formulated the assessment and plan and placed orders. The Patient requires high complexity decision making for assessment and support.  Case was discussed on  Rounds with the Respiratory Therapy Staff  Allyne Gee, MD [facility] Pulmonary Critical Care Medicine Sleep Medicine

## 2018-06-30 DIAGNOSIS — J9621 Acute and chronic respiratory failure with hypoxia: Secondary | ICD-10-CM | POA: Diagnosis not present

## 2018-06-30 DIAGNOSIS — I469 Cardiac arrest, cause unspecified: Secondary | ICD-10-CM | POA: Diagnosis not present

## 2018-06-30 DIAGNOSIS — R600 Localized edema: Secondary | ICD-10-CM | POA: Diagnosis not present

## 2018-06-30 DIAGNOSIS — J189 Pneumonia, unspecified organism: Secondary | ICD-10-CM | POA: Diagnosis not present

## 2018-06-30 LAB — POTASSIUM: Potassium: 3.7 mmol/L (ref 3.5–5.1)

## 2018-06-30 LAB — BRAIN NATRIURETIC PEPTIDE: B NATRIURETIC PEPTIDE 5: 204.7 pg/mL — AB (ref 0.0–100.0)

## 2018-06-30 NOTE — Progress Notes (Signed)
Pulmonary Critical Care Medicine [place]   PULMONARY CRITICAL CARE SERVICE  PROGRESS NOTE  Date of Service: 06/30/2018  Rick Moyer  URK:[id number]  DOB: 1955/11/08   DOA: 06/11/2018  Referring Physician: Merton Border, MD  HPI: Rick Moyer is a 62 y.o. male seen for follow up of Acute on Chronic Respiratory Failure.  Comfortable without distress at this time patient's been on pressure support requiring 50% oxygen  Medications: Reviewed on Rounds  Physical Exam:  Vitals: Temperature 97.7 pulse 72 respiratory rate 25 blood pressure 112/66  Ventilator Settings mode of ventilation pressure support FiO2 50% per support 12 PEEP 5  . General: Comfortable at this time . Eyes: Grossly normal lids, irises & conjunctiva . ENT: grossly tongue is normal . Neck: no obvious mass . Cardiovascular: S1 S2 normal no gallop . Respiratory: No rhonchi no rales . Abdomen: soft . Skin: no rash seen on limited exam . Musculoskeletal: not rigid . Psychiatric:unable to assess . Neurologic: no seizure no involuntary movements         Lab Data:   Basic Metabolic Panel: Recent Labs  Lab 06/28/18 0552 06/29/18 0652 06/30/18 0530  NA 148*  --   --   K 3.0* 3.4* 3.7  CL 106  --   --   CO2 35*  --   --   GLUCOSE 128*  --   --   BUN 38*  --   --   CREATININE 0.83  --   --   CALCIUM 8.0*  --   --     ABG: No results for input(s): PHART, PCO2ART, PO2ART, HCO3, O2SAT in the last 168 hours.  Liver Function Tests: No results for input(s): AST, ALT, ALKPHOS, BILITOT, PROT, ALBUMIN in the last 168 hours. No results for input(s): LIPASE, AMYLASE in the last 168 hours. No results for input(s): AMMONIA in the last 168 hours.  CBC: Recent Labs  Lab 06/24/18 0630 06/27/18 1017  WBC 11.5* 11.0*  NEUTROABS  --  9.6*  HGB 10.2* 9.0*  HCT 33.6* 29.9*  MCV 102.4* 103.5*  PLT 162 111*    Cardiac Enzymes: No results for input(s): CKTOTAL, CKMB, CKMBINDEX,  TROPONINI in the last 168 hours.  BNP (last 3 results) Recent Labs    06/30/18 1051  BNP 204.7*    ProBNP (last 3 results) No results for input(s): PROBNP in the last 8760 hours.  Radiological Exams: US Abdomen Limited Ruq  Result Date: 06/29/2018 CLINICAL DATA:  Liver disease, on vent EXAM: ULTRASOUND ABDOMEN LIMITED RIGHT UPPER QUADRANT COMPARISON:  None. FINDINGS: Gallbladder: Layering gallbladder sludge. No gallbladder wall thickening or pericholecystic fluid. Common bile duct: Diameter: 4 mm Liver: Hyperechoic hepatic parenchyma. No focal hepatic lesion is seen. Portal vein is patent on color Doppler imaging with normal direction of blood flow towards the liver. IMPRESSION: Hyperechoic hepatic parenchyma, nonspecific but raising the possibility of hepatic steatosis. No focal hepatic lesion is seen. Layering gallbladder sludge, without associated inflammatory changes. Electronically Signed   By: Julian Hy M.D.   On: 06/29/2018 02:45    Assessment/Plan Active Problems:   Acute on chronic respiratory failure with hypoxia (HCC)   COPD, very severe (HCC)   Bilateral lower extremity edema   Chronic congestive heart failure (HCC)   Cardiac arrest (HCC)   Bilateral pneumonia   1. Acute on chronic respiratory failure with hypoxia we will continue to wean today with going to advance him to T collar will continue secretion management pulmonary toilet.  Patient was reassured 2. Severe COPD continue with supportive care nebulizers oxygen therapy 3. On congestive heart failure monitor fluid status. 4. Status post cardiac arrest rhythm is stable 5. Bilateral pneumonia treated we will continue to monitor with follow-up x-rays as needed   I have personally seen and evaluated the patient, evaluated laboratory and imaging results, formulated the assessment and plan and placed orders. The Patient requires high complexity decision making for assessment and support.  Case was discussed on  Rounds with the Respiratory Therapy Staff  Allyne Gee, MD [facility] Pulmonary Critical Care Medicine Sleep Medicine

## 2018-07-01 ENCOUNTER — Other Ambulatory Visit (HOSPITAL_COMMUNITY): Payer: Medicare Other

## 2018-07-01 DIAGNOSIS — J189 Pneumonia, unspecified organism: Secondary | ICD-10-CM | POA: Diagnosis not present

## 2018-07-01 DIAGNOSIS — R600 Localized edema: Secondary | ICD-10-CM | POA: Diagnosis not present

## 2018-07-01 DIAGNOSIS — I469 Cardiac arrest, cause unspecified: Secondary | ICD-10-CM | POA: Diagnosis not present

## 2018-07-01 DIAGNOSIS — J9621 Acute and chronic respiratory failure with hypoxia: Secondary | ICD-10-CM | POA: Diagnosis not present

## 2018-07-01 LAB — BLOOD GAS, ARTERIAL
ACID-BASE EXCESS: 10.6 mmol/L — AB (ref 0.0–2.0)
Bicarbonate: 37.3 mmol/L — ABNORMAL HIGH (ref 20.0–28.0)
FIO2: 60
O2 SAT: 98.3 %
PATIENT TEMPERATURE: 98.6
PH ART: 7.287 — AB (ref 7.350–7.450)
pCO2 arterial: 80.7 mmHg (ref 32.0–48.0)
pO2, Arterial: 120 mmHg — ABNORMAL HIGH (ref 83.0–108.0)

## 2018-07-01 LAB — BASIC METABOLIC PANEL
Anion gap: 6 (ref 5–15)
BUN: 43 mg/dL — ABNORMAL HIGH (ref 8–23)
CALCIUM: 8.4 mg/dL — AB (ref 8.9–10.3)
CO2: 36 mmol/L — ABNORMAL HIGH (ref 22–32)
CREATININE: 0.73 mg/dL (ref 0.61–1.24)
Chloride: 105 mmol/L (ref 98–111)
GFR calc Af Amer: >60 mL/min (ref >60)
GLUCOSE: 133 mg/dL — AB (ref 70–99)
Potassium: 3.7 mmol/L (ref 3.5–5.1)
SODIUM: 147 mmol/L — AB (ref 135–145)

## 2018-07-01 LAB — PROTIME-INR
INR: 1.09
PROTHROMBIN TIME: 14.1 s (ref 11.4–15.2)

## 2018-07-01 NOTE — Progress Notes (Signed)
Pulmonary Critical Care Medicine [place]   PULMONARY CRITICAL CARE SERVICE  PROGRESS NOTE  Date of Service: 07/01/2018  Rick Moyer  RSW:[id number]  DOB: Jul 05, 1956   DOA: 06/11/2018  Referring Physician: Merton Border, MD  HPI: Rick Moyer is a 62 y.o. male seen for follow up of Acute on Chronic Respiratory Failure.  Patient is on T collar right now has been off the ventilator since yesterday requiring about 50% FiO2  Medications: Reviewed on Rounds  Physical Exam:  Vitals: Temperature 97.6 pulse 114 respiratory rate 18 blood pressure was 93/50 saturations 100%  Ventilator Settings currently off the ventilator on T collar trials with 50% FiO2  . General: Comfortable at this time . Eyes: Grossly normal lids, irises & conjunctiva . ENT: grossly tongue is normal . Neck: no obvious mass . Cardiovascular: S1 S2 normal no gallop . Respiratory: No rhonchi or rales are noted . Abdomen: soft . Skin: no rash seen on limited exam . Musculoskeletal: not rigid . Psychiatric:unable to assess . Neurologic: no seizure no involuntary movements         Lab Data:   Basic Metabolic Panel: Recent Labs  Lab 06/28/18 0552 06/29/18 0652 06/30/18 0530 07/01/18 0701  NA 148*  --   --  147*  K 3.0* 3.4* 3.7 3.7  CL 106  --   --  105  CO2 35*  --   --  36*  GLUCOSE 128*  --   --  133*  BUN 38*  --   --  43*  CREATININE 0.83  --   --  0.73  CALCIUM 8.0*  --   --  8.4*    ABG: No results for input(s): PHART, PCO2ART, PO2ART, HCO3, O2SAT in the last 168 hours.  Liver Function Tests: No results for input(s): AST, ALT, ALKPHOS, BILITOT, PROT, ALBUMIN in the last 168 hours. No results for input(s): LIPASE, AMYLASE in the last 168 hours. No results for input(s): AMMONIA in the last 168 hours.  CBC: Recent Labs  Lab 06/27/18 1017  WBC 11.0*  NEUTROABS 9.6*  HGB 9.0*  HCT 29.9*  MCV 103.5*  PLT 111*    Cardiac Enzymes: No results for input(s):  CKTOTAL, CKMB, CKMBINDEX, TROPONINI in the last 168 hours.  BNP (last 3 results) Recent Labs    06/30/18 1051  BNP 204.7*    ProBNP (last 3 results) No results for input(s): PROBNP in the last 8760 hours.  Radiological Exams: No results found.  Assessment/Plan Active Problems:   Acute on chronic respiratory failure with hypoxia (HCC)   COPD, very severe (HCC)   Bilateral lower extremity edema   Chronic congestive heart failure (HCC)   Cardiac arrest (HCC)   Bilateral pneumonia   1. Acute on chronic respiratory failure with hypoxia we will continue with weaning on T: Patient seems to be tolerating it well.  Continue with pulmonary toilet supportive care. 2. Severe COPD at baseline continue present management 3. Chronic congestive heart failure stable monitor fluid status 4. Cardiac arrest rhythm has been stable 5. Bilateral pneumonia treated we will follow x-ray as necessary   I have personally seen and evaluated the patient, evaluated laboratory and imaging results, formulated the assessment and plan and placed orders. The Patient requires high complexity decision making for assessment and support.  Case was discussed on Rounds with the Respiratory Therapy Staff  Allyne Gee, MD [facility] Pulmonary Critical Care Medicine Sleep Medicine

## 2018-07-01 NOTE — Consult Note (Addendum)
Chief Complaint: Patient was seen in consultation today for percutaneous gastric tube placement at the request of Dr Mariah Milling    Supervising Physician: Corrie Mckusick  Patient Status: [facility] - Out-pt  History of Present Illness: Rick Moyer is a 62 y.o. male   Severe COPD Acute on chronic respiratory failure Cardiac arrest Now vent/trach Admitted to [facility] for vent management and wean Aspiration pneumonia Need for long term care Dysphagia Deconditioning  Request for percutaneous gastric tube placement Imaging reviewed and approved with Dr Barbie Banner  Past Medical History:  Diagnosis Date  . Acute on chronic respiratory failure with hypoxia ([name])   . Anxiety   . Bilateral lower extremity edema   . Bilateral pneumonia 06/17/2018  . Cardiac arrest ([name])   . Chronic atrial fibrillation ([name])   . Chronic back pain   . Chronic congestive heart failure ([name])   . COPD, very severe ([name])   . Depression   . Hepatitis C   . Lung cancer [name])     Past Surgical History:  Procedure Laterality Date  . APPENDECTOMY    . TONSILLECTOMY    . TRACHEOSTOMY      Allergies: Patient has no allergy information on record.  Medications: Prior to Admission medications   Not on File     Family History  Family history unknown: Yes    Social History   Socioeconomic History  . Marital status: Unknown    Spouse name: Not on file  . Number of children: Not on file  . Years of education: Not on file  . Highest education level: Not on file  Occupational History  . Not on file  Social Needs  . Financial resource strain: Not on file  . Food insecurity:    Worry: Not on file    Inability: Not on file  . Transportation needs:    Medical: Not on file    Non-medical: Not on file  Tobacco Use  . Smoking status: Former Research scientist (life sciences)  . Smokeless tobacco: Never Used  Substance and Sexual Activity  . Alcohol use: Not Currently  . Drug use: Not Currently  . Sexual activity: Not  Currently  Lifestyle  . Physical activity:    Days per week: Not on file    Minutes per session: Not on file  . Stress: Not on file  Relationships  . Social connections:    Talks on phone: Not on file    Gets together: Not on file    Attends religious service: Not on file    Active member of club or organization: Not on file    Attends meetings of clubs or organizations: Not on file    Relationship status: Not on file  Other Topics Concern  . Not on file  Social History Narrative  . Not on file    Review of Systems: A 12 point ROS discussed and pertinent positives are indicated in the HPI above.  All other systems are negative.  Review of Systems  Constitutional:       Tries to communicate On vent  Respiratory:       Vent/trach  Psychiatric/Behavioral: Positive for confusion.    Vital Signs: There were no vitals taken for this visit.  Physical Exam  Cardiovascular: Normal rate and regular rhythm.  Pulmonary/Chest: He is in respiratory distress.  Abdominal: Soft. Bowel sounds are normal.  Musculoskeletal:  Does not follow commands   Neurological:  Non verbal Seems to try to communicate  Skin: Skin is  warm.  Psychiatric:  Consent with Brother Lennette Bihari via phone   Vitals reviewed.   Imaging: Dg Abd 1 View  Result Date: 06/12/2018 CLINICAL DATA:  Ventilated patient. Subsequent nasogastric tube placement. EXAM: ABDOMEN - 1 VIEW COMPARISON:  Abdominal radiograph of June 12, 2018 FINDINGS: There has been interval advancement of the nasogastric tube. Both the tip and proximal port appear lie within the gastric body. IMPRESSION: Interval advancement of the nasogastric tube with good positioning of both the tip and proximal port in the gastric body. Electronically Signed   By: David  Martinique M.D.   On: 06/12/2018 10:49   Dg Abd 1 View  Result Date: 06/12/2018 CLINICAL DATA:  Status post nasogastric tube placement. Bill aided patient with tracheostomy. EXAM: ABDOMEN -  1 VIEW COMPARISON:  Abdominal radiograph of June 11, 2018 FINDINGS: The radiodense tipped feeding tube is been removed. An esophagogastric tube has been placed whose tip projects in the gastric cardia and the proximal port above the GE junction. IMPRESSION: Advancement of the nasogastric tube by between 10 and 15 cm is recommended to assure that both the proximal port and tip lie below the GE junction. Electronically Signed   By: David  Martinique M.D.   On: 06/12/2018 09:27   Ct Angio Chest Pe W Or Wo Contrast  Result Date: 06/23/2018 CLINICAL DATA:  Hypoxia, interstitial lung disease EXAM: CT ANGIOGRAPHY CHEST WITH CONTRAST TECHNIQUE: Multidetector CT imaging of the chest was performed using the standard protocol during bolus administration of intravenous contrast. Multiplanar CT image reconstructions and MIPs were obtained to evaluate the vascular anatomy. CONTRAST:  160mL ISOVUE-370 IOPAMIDOL (ISOVUE-370) INJECTION 76% COMPARISON:  None. FINDINGS: Cardiovascular: Four-chamber cardiac enlargement. Trace pericardial effusion. Dilatation of central pulmonary arteries. Satisfactory opacification of pulmonary arteries noted, and there is no evidence of pulmonary emboli. Scattered coronary calcifications and aortic leaflet calcifications. Ascending aorta is dilated to 4.9 cm diameter, proximal arch 3.9 cm. Fair aortic opacification with no suggestion of dissection or stenosis. Classic 3 vessel brachiocephalic arterial origin anatomy. Atheromatous plaque in the visualized proximal abdominal aorta without aneurysm. Mediastinum/Nodes: No hilar or mediastinal adenopathy. Tracheostomy device projects in expected location. Lungs/Pleura: No pleural effusion. No pneumothorax. 2.2 cm spiculated pleural based lesion in the posterior apical right upper lobe. Pulmonary emphysema. 2.4 cm semi-solid lesion in the superior segment right lower lobe. Some linear scarring in the left lower lobe. Left lung otherwise clear. Upper  Abdomen: Enteric tube extends to the proximal duodenum. 5 mm upper pole left renal calculus. No acute findings. Musculoskeletal: Mild compression deformity of T9. Mild superior endplate compression deformity of L1. Spondylitic changes in the visualized lower cervical spine. Healing bilateral rib and sternal fractures. Review of the MIP images confirms the above findings. IMPRESSION: 1. Negative for acute PE. 2. 2.2 right upper lobe and 2.4 cm right lower lobe lung lesions. Consultation with pulmonary medicine or thoracic surgery suggested, as clinically appropriate. 3. Coronary and Aortic Atherosclerosis (ICD10-170.0). 4. 4.9 cm ascending thoracic aortic aneurysm NOS (ICD10-I71.9). Ascending thoracic aortic aneurysm. Recommend semi-annual imaging followup by CTA or MRA and referral to cardiothoracic surgery if not already obtained. This recommendation follows 2010 ACCF/AHA/AATS/ACR/ASA/SCA/SCAI/SIR/STS/SVM Guidelines for the Diagnosis and Management of Patients With Thoracic Aortic Disease. Circulation. 2010; 121: e266-e369 5. Compression deformities of T9 and L1, age indeterminate. 6. Left nephrolithiasis Electronically Signed   By: Lucrezia Europe M.D.   On: 06/23/2018 16:56   Dg Chest Port 1 View  Result Date: 06/27/2018 CLINICAL DATA:  Chest pain.  History of  pneumothorax. EXAM: PORTABLE CHEST 1 VIEW COMPARISON:  CT 06/23/2018.  Chest x-ray 06/22/2018. FINDINGS: Tracheostomy tube noted with its tip 6 cm above the carina. NG tube noted with its tip below left hemidiaphragm. Cardiomegaly with normal pulmonary vascularity. Left base atelectasis/infiltrate. No pleural effusion or pneumothorax. IMPRESSION: 1.  Tracheostomy tube and NG tube noted good anatomic position. 2.  Cardiomegaly with normal pulmonary vascularity. 3.  Left base atelectasis/infiltrate. Electronically Signed   By: Marcello Moores  Register   On: 06/27/2018 11:47   Dg Chest Port 1 View  Result Date: 06/22/2018 CLINICAL DATA:  62 y/o M; tracheostomy tube  in place. Respiratory failure and history of pneumonia. EXAM: PORTABLE CHEST 1 VIEW COMPARISON:  06/20/2018 chest radiograph. FINDINGS: Stable cardiomegaly given projection and technique. Aortic atherosclerosis with calcification. Stable mild opacities with basilar predominance. Stable tracheostomy tube with tip 6.6 cm above the carina. Enteric tube tip extends below the field of view into the abdomen. No pleural effusion or pneumothorax. No acute osseous abnormality is evident. IMPRESSION: 1. Stable mild pulmonary opacities with basilar predominance. 2. Tracheostomy and enteric tubes are stable. 3. Stable cardiomegaly given projection and technique. Electronically Signed   By: Kristine Garbe M.D.   On: 06/22/2018 06:47   Dg Chest Port 1 View  Result Date: 06/20/2018 CLINICAL DATA:  Respiratory failure, history of pneumonia and lung carcinoma EXAM: PORTABLE CHEST 1 VIEW COMPARISON:  Portable chest x-ray of 06/12/2018 and 06/11/2017 FINDINGS: Aeration has improved considerably and the opacities at the lung bases noted previously have almost completely resolved on this portable chest x-ray in AP projection. Minimally prominent markings remain at the lung bases. Cardiomegaly is stable. Tracheostomy remains. NG tube extends below the hemidiaphragm. IMPRESSION: 1. Significant improvement in aeration of the lung bases with only minimally prominent markings remaining at the lung bases. 2. Stable mild cardiomegaly with tracheostomy remaining. Electronically Signed   By: Ivar Drape M.D.   On: 06/20/2018 09:44   Dg Chest Port 1 View  Result Date: 06/12/2018 CLINICAL DATA:  Hypoxia.  History of chest tube placement. EXAM: PORTABLE CHEST 1 VIEW COMPARISON:  Chest radiograph yesterday at 1535 hour FINDINGS: Previous enteric tube is no longer visualized. Previous right central line is no longer visualized. Tracheostomy tube projects over the thoracic inlet. Left upper extremity PICC tip in the proximal SVC. The  lungs are hyperinflated with emphysema. Worsening pleuroparenchymal opacity at the right lung base. Left lung base and costophrenic angle excluded from the field of view. The heart is enlarged. No visualized pneumothorax. The left rib fractures on prior exam are not as well seen currently. IMPRESSION: 1. Progressive pleuroparenchymal opacity at the right lung base suspicious for increasing pleural effusion and atelectasis or airspace disease. 2. Again seen hyperinflation and likely emphysema. Left lung base and costophrenic angle not included in the field of view. 3. Right central line and enteric tube have been removed in the interim. Tracheostomy tube at the thoracic inlet. 4. No visualized pneumothorax. Electronically Signed   By: Keith Rake M.D.   On: 06/12/2018 01:27   Dg Chest Port 1 View  Result Date: 06/11/2018 CLINICAL DATA:  Pneumothorax, line and tube placement EXAM: PORTABLE CHEST 1 VIEW COMPARISON:  None. FINDINGS: Left upper extremity catheter tip overlies the SVC. Esophageal tube extends below diaphragm but is not included. Apparent tracheostomy tube, tip poorly visible. Hyperinflation with probable emphysematous disease. Increased airspace disease at the right greater than left lung bases. Mild cardiomegaly with aortic atherosclerosis. Vague pleural and parenchymal opacity in  the left lower lung. No definitive pneumothorax. Old appearing left-sided rib fractures. Age indeterminate left third, fourth, and fifth rib fractures. IMPRESSION: 1. No definitive pneumothorax seen 2. Hyperinflation with probable emphysematous disease. Increased airspace disease at the right greater than left lung base may reflect atelectasis or pneumonia. Pleural and parenchymal opacity in the left lower lung, potential scarring versus nodule. No priors available for comparison. Consider correlation with chest CT. 3. Mild cardiomegaly. 4. Multiple left-sided rib fractures, left third through fifth rib fractures  appear age indeterminate/possibly acute. Electronically Signed   By: Donavan Foil M.D.   On: 06/11/2018 16:00   Dg Abd Portable 1v  Result Date: 06/11/2018 CLINICAL DATA:  NG tube placement EXAM: PORTABLE ABDOMEN - 1 VIEW COMPARISON:  None. FINDINGS: Esophageal tube tip overlies the expected location of second portion of duodenum. Visible gas pattern is nonobstructed IMPRESSION: Esophageal tube tip projects over expected location of proximal duodenum. Electronically Signed   By: Donavan Foil M.D.   On: 06/11/2018 16:00   US Abdomen Limited Ruq  Result Date: 06/29/2018 CLINICAL DATA:  Liver disease, on vent EXAM: ULTRASOUND ABDOMEN LIMITED RIGHT UPPER QUADRANT COMPARISON:  None. FINDINGS: Gallbladder: Layering gallbladder sludge. No gallbladder wall thickening or pericholecystic fluid. Common bile duct: Diameter: 4 mm Liver: Hyperechoic hepatic parenchyma. No focal hepatic lesion is seen. Portal vein is patent on color Doppler imaging with normal direction of blood flow towards the liver. IMPRESSION: Hyperechoic hepatic parenchyma, nonspecific but raising the possibility of hepatic steatosis. No focal hepatic lesion is seen. Layering gallbladder sludge, without associated inflammatory changes. Electronically Signed   By: Julian Hy M.D.   On: 06/29/2018 02:45    Labs:  CBC: Recent Labs    06/12/18 0630 06/20/18 0553 06/22/18 0607 06/24/18 0630 06/27/18 1017  WBC 8.5 9.8  --  11.5* 11.0*  HGB 11.0* 11.6* 10.4* 10.2* 9.0*  HCT 36.2* 36.9* 34.0* 33.6* 29.9*  PLT 166 176  --  162 111*    COAGS: No results for input(s): INR, APTT in the last 8760 hours.  BMP: Recent Labs    06/16/18 0618 06/20/18 0553  06/28/18 0552 06/29/18 0652 06/30/18 0530 07/01/18 0701  NA 137 144  --  148*  --   --  147*  K 3.7 3.2*   < > 3.0* 3.4* 3.7 3.7  CL 95* 98  --  106  --   --  105  CO2 33* 33*  --  35*  --   --  36*  GLUCOSE 173* 148*  --  128*  --   --  133*  BUN 33* 59*  --  38*  --    --  43*  CALCIUM 8.8* 8.8*  --  8.0*  --   --  8.4*  CREATININE 0.81 0.82  --  0.83  --   --  0.73  GFRNONAA >60 >60  --  >60  --   --  >60  GFRAA >60 >60  --  >60  --   --  >60   < > = values in this interval not displayed.    LIVER FUNCTION TESTS: Recent Labs    06/12/18 0630  BILITOT 1.4*  AST 16  ALT 18  ALKPHOS 80  PROT 6.3*  ALBUMIN 2.6*    TUMOR MARKERS: No results for input(s): AFPTM, CEA, CA199, CHROMGRNA in the last 8760 hours.  Assessment and Plan:  Dysphagia Deconditioned Aspiration pneumonia Need for long term care Trach/vent-- severe COPD-- resp failure Scheduled  for probable percutaneous gastric tube in IR this week Risks and benefits discussed with the patient's Brother Lennette Bihari via phone including, but not limited to the need for a barium enema during the procedure, bleeding, infection, peritonitis, or damage to adjacent structures.  All of his questions were answered, he is agreeable to proceed. Consent signed and in chart.  Thank you for this interesting consult.  I greatly enjoyed meeting KAIO KUHLMAN and look forward to participating in their care.  A copy of this report was sent to the requesting provider on this date.  Electronically Signed: Lavonia Drafts, PA-C 07/01/2018, 11:52 AM   I spent a total of 40 Minutes    in face to face in clinical consultation, greater than 50% of which was counseling/coordinating care for percutaneous gastric tube placement

## 2018-07-02 ENCOUNTER — Institutional Professional Consult (permissible substitution) (HOSPITAL_COMMUNITY): Payer: Medicare Other

## 2018-07-02 ENCOUNTER — Encounter (HOSPITAL_COMMUNITY): Payer: Self-pay | Admitting: Interventional Radiology

## 2018-07-02 DIAGNOSIS — J9621 Acute and chronic respiratory failure with hypoxia: Secondary | ICD-10-CM | POA: Diagnosis not present

## 2018-07-02 DIAGNOSIS — J189 Pneumonia, unspecified organism: Secondary | ICD-10-CM | POA: Diagnosis not present

## 2018-07-02 DIAGNOSIS — I469 Cardiac arrest, cause unspecified: Secondary | ICD-10-CM | POA: Diagnosis not present

## 2018-07-02 DIAGNOSIS — R600 Localized edema: Secondary | ICD-10-CM | POA: Diagnosis not present

## 2018-07-02 HISTORY — PX: IR FLUORO RM 30-60 MIN: IMG2384

## 2018-07-02 MED ORDER — CEFAZOLIN SODIUM-DEXTROSE 2-4 GM/100ML-% IV SOLN: 2.0000 g | Freq: Three times a day (TID) | INTRAVENOUS | Status: DC

## 2018-07-02 MED ORDER — FENTANYL CITRATE (PF) 100 MCG/2ML IJ SOLN
INTRAMUSCULAR | Status: AC | PRN
  Administered 2018-07-02: 25 ug via INTRAVENOUS

## 2018-07-02 MED ORDER — LIDOCAINE HCL 1 % IJ SOLN
INTRAMUSCULAR | Status: AC
  Filled 2018-07-02: qty 20

## 2018-07-02 MED ORDER — MIDAZOLAM HCL 2 MG/2ML IJ SOLN
INTRAMUSCULAR | Status: AC
  Filled 2018-07-02: qty 2

## 2018-07-02 MED ORDER — MIDAZOLAM HCL 2 MG/2ML IJ SOLN
INTRAMUSCULAR | Status: AC | PRN
  Administered 2018-07-02: 1 mg via INTRAVENOUS

## 2018-07-02 MED ORDER — FENTANYL CITRATE (PF) 100 MCG/2ML IJ SOLN
INTRAMUSCULAR | Status: AC
  Filled 2018-07-02: qty 2

## 2018-07-02 MED ORDER — LIDOCAINE HCL (PF) 1 % IJ SOLN
INTRAMUSCULAR | Status: AC | PRN
  Administered 2018-07-02: 5 mL

## 2018-07-02 MED ORDER — GLUCAGON HCL RDNA (DIAGNOSTIC) 1 MG IJ SOLR
INTRAMUSCULAR | Status: AC | PRN
  Administered 2018-07-02: 1 mg via INTRAVENOUS

## 2018-07-02 MED ORDER — IOPAMIDOL (ISOVUE-300) INJECTION 61%
INTRAVENOUS | Status: AC
  Filled 2018-07-02: qty 50

## 2018-07-02 MED ORDER — GLUCAGON HCL RDNA (DIAGNOSTIC) 1 MG IJ SOLR
INTRAMUSCULAR | Status: AC
  Filled 2018-07-02: qty 1

## 2018-07-02 NOTE — Progress Notes (Signed)
Pulmonary Critical Care Medicine [place]   PULMONARY CRITICAL CARE SERVICE  PROGRESS NOTE  Date of Service: 07/02/2018  Rick Moyer  VOJ:[id number]  DOB: 16-Dec-1955   DOA: 06/11/2018  Referring Physician: Merton Border, MD  HPI: Rick Moyer is a 62 y.o. male seen for follow up of Acute on Chronic Respiratory Failure.  Patient is on T collar.  He was requiring about 50% FiO2  Medications: Reviewed on Rounds  Physical Exam:  Vitals: Temperature 97.6 pulse 98 respiratory rate 15 blood pressure 100/60 saturation 96%  Ventilator Settings currently on T collar FiO2 50%  . General: Comfortable at this time . Eyes: Grossly normal lids, irises & conjunctiva . ENT: grossly tongue is normal . Neck: no obvious mass . Cardiovascular: S1 S2 normal no gallop . Respiratory: No rhonchi no rales . Abdomen: soft . Skin: no rash seen on limited exam . Musculoskeletal: not rigid . Psychiatric:unable to assess . Neurologic: no seizure no involuntary movements         Lab Data:   Basic Metabolic Panel: Recent Labs  Lab 06/28/18 0552 06/29/18 0652 06/30/18 0530 07/01/18 0701  NA 148*  --   --  147*  K 3.0* 3.4* 3.7 3.7  CL 106  --   --  105  CO2 35*  --   --  36*  GLUCOSE 128*  --   --  133*  BUN 38*  --   --  43*  CREATININE 0.83  --   --  0.73  CALCIUM 8.0*  --   --  8.4*    ABG: Recent Labs  Lab 07/01/18 1000  PHART 7.287*  PCO2ART 80.7*  PO2ART 120*  HCO3 37.3*  O2SAT 98.3    Liver Function Tests: No results for input(s): AST, ALT, ALKPHOS, BILITOT, PROT, ALBUMIN in the last 168 hours. No results for input(s): LIPASE, AMYLASE in the last 168 hours. No results for input(s): AMMONIA in the last 168 hours.  CBC: Recent Labs  Lab 06/27/18 1017  WBC 11.0*  NEUTROABS 9.6*  HGB 9.0*  HCT 29.9*  MCV 103.5*  PLT 111*    Cardiac Enzymes: No results for input(s): CKTOTAL, CKMB, CKMBINDEX, TROPONINI in the last 168 hours.  BNP (last  3 results) Recent Labs    06/30/18 1051  BNP 204.7*    ProBNP (last 3 results) No results for input(s): PROBNP in the last 8760 hours.  Radiological Exams: Dg Chest Port 1 View  Result Date: 07/01/2018 CLINICAL DATA:  Hypoxemia.  Desaturation. EXAM: PORTABLE CHEST 1 VIEW COMPARISON:  06/27/2018 and 06/22/2018 radiographs.  CT 06/23/2018. FINDINGS: 1209 hours. The extreme lung bases are excluded. The tracheostomy and enteric tube appear unchanged. There is stable cardiomegaly with vascular congestion and mild left basilar airspace disease. No large pleural effusion or pneumothorax demonstrated. IMPRESSION: No demonstrated change in support system and left basilar airspace disease. The lung bases are incompletely visualized. Electronically Signed   By: Richardean Sale M.D.   On: 07/01/2018 12:26    Assessment/Plan Active Problems:   Acute on chronic respiratory failure with hypoxia (HCC)   COPD, very severe (HCC)   Bilateral lower extremity edema   Chronic congestive heart failure (HCC)   Cardiac arrest (HCC)   Bilateral pneumonia   1. Acute on chronic respiratory failure with hypoxia we will continue with T collar titrate oxygen as tolerated patient is doing a little bit better from the perspective of his ability to come off the ventilator however  oxygen requirements are still elevated.  Last chest x-ray had shown mild basilar airspace disease likely scarring no effusions were noted. 2. Severe COPD continue with oxygen therapy and supportive care. 3. Bilateral edema diuresis as tolerated. 4. Chronic congestive heart failure monitor fluid status. 5. Status post cardiac arrest rhythm has been stable 6. Bilateral pneumonia treated x-ray results as noted above   I have personally seen and evaluated the patient, evaluated laboratory and imaging results, formulated the assessment and plan and placed orders. The Patient requires high complexity decision making for assessment and support.   Case was discussed on Rounds with the Respiratory Therapy Staff  Allyne Gee, MD [facility] Pulmonary Critical Care Medicine Sleep Medicine

## 2018-07-02 NOTE — Procedures (Signed)
Pre procedure Dx: Dysphagia Post Procedure Dx: Same  No safe percutaneous window for gastrostomy tube placement secondary to interposed / overlying transverse colon.   If gastrostomy tube placement is still desired, surgical consultation is recommended.   Ronny Bacon, MD Pager #: [phone]

## 2018-07-03 DIAGNOSIS — I469 Cardiac arrest, cause unspecified: Secondary | ICD-10-CM | POA: Diagnosis not present

## 2018-07-03 DIAGNOSIS — J189 Pneumonia, unspecified organism: Secondary | ICD-10-CM | POA: Diagnosis not present

## 2018-07-03 DIAGNOSIS — R600 Localized edema: Secondary | ICD-10-CM | POA: Diagnosis not present

## 2018-07-03 DIAGNOSIS — J9621 Acute and chronic respiratory failure with hypoxia: Secondary | ICD-10-CM | POA: Diagnosis not present

## 2018-07-03 LAB — HEMOGLOBIN AND HEMATOCRIT, BLOOD
HCT: 26.4 % — ABNORMAL LOW (ref 39.0–52.0)
HEMATOCRIT: 24.6 % — AB (ref 39.0–52.0)
HEMOGLOBIN: 7.8 g/dL — AB (ref 13.0–17.0)
Hemoglobin: 7.2 g/dL — ABNORMAL LOW (ref 13.0–17.0)

## 2018-07-03 LAB — CBC
HEMATOCRIT: 25.3 % — AB (ref 39.0–52.0)
HEMOGLOBIN: 7.5 g/dL — AB (ref 13.0–17.0)
MCH: 31.6 pg (ref 26.0–34.0)
MCHC: 29.6 g/dL — ABNORMAL LOW (ref 30.0–36.0)
MCV: 106.8 fL — AB (ref 78.0–100.0)
Platelets: 81 10*3/uL — ABNORMAL LOW (ref 150–400)
RBC: 2.37 MIL/uL — AB (ref 4.22–5.81)
RDW: 14.5 % (ref 11.5–15.5)
WBC: 4.7 10*3/uL (ref 4.0–10.5)

## 2018-07-03 LAB — PREPARE RBC (CROSSMATCH)

## 2018-07-03 LAB — ABO/RH: ABO/RH(D): A NEG
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
## 2018-07-03 NOTE — Progress Notes (Signed)
Pulmonary Critical Care Medicine [place]   PULMONARY CRITICAL CARE SERVICE  PROGRESS NOTE  Date of Service: 07/03/2018  Rick Moyer  WLS:[id number]  DOB: 12-Jul-1956   DOA: 06/11/2018  Referring Physician: Merton Border, MD  HPI: Rick Moyer is a 62 y.o. male seen for follow up of Acute on Chronic Respiratory Failure.  Patient right now is comfortable without distress had a rapid response occurred this morning.  Apparently patient was sitting in the chair and became unresponsive.  Rapid response was called placed was placed back on the ventilator now he is awake and alert.  He is on assist control and is on 60% FiO2.  Reportedly patient had received methadone and there is some question whether that change in status could have been related to the methadone.  In addition I had a conversation with the patient's brother and sister-in-law.  They had many questions concerning the patient's status.  They seemed a bit surprised that he was actually weaning and I explained to them that the patient's status is dynamic and not static and it appeared that he had been doing well since our last conversation and he had actually been weaning off the ventilator on T collar trials.  The change in status for today I felt was more related to possibly medications.  They were concerned about coming down to actually see him whether he would want to stay alive on the ventilator.  He is competent to make that decision for himself.  I suggested that they make a trip down from [place] to come see him and discuss his options.  At this point if he does not completely become liberated from the ventilator he may require skilled nursing facility placement and even if he is not decannulated he may still require skilled nursing facility placement based on his insurance.  The case manager is going to discuss these matters with them further.  Currently we are going to reassess his weaning potential after he  is rested and then if he does well will be started back on his wean.  As far as his medications are concerned I deferred the narcotics issue to his pain management physician so that he can discuss with the family regarding what medications he is on and why he is on them.  Medications: Reviewed on Rounds  Physical Exam:  Vitals: Temperature 98.0 pulse 74 respiratory rate 30 blood pressure 92/54 saturations are 99%  Ventilator Settings currently on assist control FiO2 60% tidal volume 512 PEEP 5  . General: Comfortable at this time . Eyes: Grossly normal lids, irises & conjunctiva . ENT: grossly tongue is normal . Neck: no obvious mass . Cardiovascular: S1 S2 normal no gallop . Respiratory: Few scattered rhonchi are noted at this time. . Abdomen: soft . Skin: no rash seen on limited exam . Musculoskeletal: not rigid . Psychiatric:unable to assess . Neurologic: no seizure no involuntary movements         Lab Data:   Basic Metabolic Panel: Recent Labs  Lab 06/28/18 0552 06/29/18 0652 06/30/18 0530 07/01/18 0701  NA 148*  --   --  147*  K 3.0* 3.4* 3.7 3.7  CL 106  --   --  105  CO2 35*  --   --  36*  GLUCOSE 128*  --   --  133*  BUN 38*  --   --  43*  CREATININE 0.83  --   --  0.73  CALCIUM 8.0*  --   --  8.4*    ABG: Recent Labs  Lab 07/01/18 1000  PHART 7.287*  PCO2ART 80.7*  PO2ART 120*  HCO3 37.3*  O2SAT 98.3    Liver Function Tests: No results for input(s): AST, ALT, ALKPHOS, BILITOT, PROT, ALBUMIN in the last 168 hours. No results for input(s): LIPASE, AMYLASE in the last 168 hours. No results for input(s): AMMONIA in the last 168 hours.  CBC: Recent Labs  Lab 06/27/18 1017 07/03/18 0455 07/03/18 1307  WBC 11.0* 4.7  --   NEUTROABS 9.6*  --   --   HGB 9.0* 7.5* 7.2*  HCT 29.9* 25.3* 24.6*  MCV 103.5* 106.8*  --   PLT 111* 81*  --     Cardiac Enzymes: No results for input(s): CKTOTAL, CKMB, CKMBINDEX, TROPONINI in the last 168 hours.  BNP  (last 3 results) Recent Labs    06/30/18 1051  BNP 204.7*    ProBNP (last 3 results) No results for input(s): PROBNP in the last 8760 hours.  Radiological Exams: Ir Fluoro Rm 30-60 Min  Result Date: 07/02/2018 INDICATION: Dysphagia. Request made for percutaneous gastrostomy tube placement for enteric nutrition supplementation purposes. EXAM: PULL TROUGH GASTROSTOMY TUBE PLACEMENT COMPARISON:  Chest CT-06/23/2018 MEDICATIONS: Ancef 2 gm IV; Antibiotics were administered within 1 hour of the procedure. Glucagon 1 mg IV CONTRAST:  None ANESTHESIA/SEDATION: Moderate (conscious) sedation was employed during this procedure. A total of Versed 1 mg and Fentanyl 25 mcg was administered intravenously. Moderate Sedation Time: 14 minutes. The patient's level of consciousness and vital signs were monitored continuously by radiology nursing throughout the procedure under my direct supervision. FLUOROSCOPY TIME:  3 minutes (15 mGy) COMPLICATIONS: None immediate. PROCEDURE: Informed written consent was obtained from patient's family following explanation of the procedure, risks, benefits and alternatives. A time out was performed prior to the initiation of the procedure. Ultrasound scanning was performed to demarcate the edge of the left lobe of the liver. Maximal barrier sterile technique utilized including caps, mask, sterile gowns, sterile gloves, large sterile drape, hand hygiene and Betadine prep. The left upper quadrant was sterilely prepped and draped. An oral gastric catheter was inserted into the stomach under fluoroscopy. Despite adequate gaseous distention of the stomach, a safe percutaneous window could not be obtained secondary to interposed transverse colon as demonstrated on preceding chest CT. FINDINGS: Despite adequate gaseous distention of the stomach, a safe percutaneous window could not be obtained secondary to interposed transverse colon as demonstrated on preceding chest CT. As such, no attempt  was made for placement of a percutaneous gastrostomy tube. IMPRESSION: No safe percutaneous window for gastrostomy tube placement secondary to interposed / overlying transverse colon. If gastrostomy tube placement is still desired, surgical consultation is recommended. Electronically Signed   By: Sandi Mariscal M.D.   On: 07/02/2018 16:04    Assessment/Plan Active Problems:   Acute on chronic respiratory failure with hypoxia (HCC)   COPD, very severe (H[facility])   Bilateral lower extremity edema   Chronic congestive heart failure ([facility])   Cardiac arrest ([facility])   Bilateral pneumonia   1. Acute on chronic respiratory failure with hypoxia he had an acute event had to be placed back on the ventilator.  Blood pressure is running low may need to be placed on pressors but I think it was more than likely medication related.  Hopefully will be able to place him back on the wean once he has stabilized.  Pain management should reassess his medications and adjust accordingly. 2. Severe COPD at baseline  we will continue with present therapy. 3. Chronic congestive heart failure continue with supportive care 4. Cardiac arrest rhythm is stable had a rapid response as indicated but he actually never lost his pulse and did not require CPR 5. Bilateral pneumonia treated follow-up x-ray as necessary 6. PEG placement patient will need a PEG placed surgically because of his CT findings as above if the family and the patient decide to have the PEG placed   I have personally seen and evaluated the patient, evaluated laboratory and imaging results, formulated the assessment and plan and placed orders. The Patient requires high complexity decision making for assessment and support.  Case was discussed on Rounds with the Respiratory Therapy Staff time spent 35 minutes patient is critically ill in danger of cardiac arrest and rapid response as noted above we will continue to monitor aggressively  Allyne Gee, MD [facility] Pulmonary  Critical Care Medicine Sleep Medicine

## 2018-07-04 DIAGNOSIS — I469 Cardiac arrest, cause unspecified: Secondary | ICD-10-CM | POA: Diagnosis not present

## 2018-07-04 DIAGNOSIS — J9621 Acute and chronic respiratory failure with hypoxia: Secondary | ICD-10-CM | POA: Diagnosis not present

## 2018-07-04 DIAGNOSIS — J189 Pneumonia, unspecified organism: Secondary | ICD-10-CM | POA: Diagnosis not present

## 2018-07-04 DIAGNOSIS — R600 Localized edema: Secondary | ICD-10-CM | POA: Diagnosis not present

## 2018-07-04 LAB — BPAM RBC
BLOOD PRODUCT EXPIRATION DATE: 201910192359
ISSUE DATE / TIME: 201910022146
Unit Type and Rh: 600

## 2018-07-04 LAB — CBC
HEMATOCRIT: 26.6 % — AB (ref 39.0–52.0)
Hemoglobin: 7.9 g/dL — ABNORMAL LOW (ref 13.0–17.0)
MCH: 31.3 pg (ref 26.0–34.0)
MCHC: 29.7 g/dL — ABNORMAL LOW (ref 30.0–36.0)
MCV: 105.6 fL — AB (ref 78.0–100.0)
PLATELETS: 88 10*3/uL — AB (ref 150–400)
RBC: 2.52 MIL/uL — AB (ref 4.22–5.81)
RDW: 15.3 % (ref 11.5–15.5)
WBC: 5.2 10*3/uL (ref 4.0–10.5)

## 2018-07-04 LAB — HEMOGLOBIN AND HEMATOCRIT, BLOOD
HCT: 26.8 % — ABNORMAL LOW (ref 39.0–52.0)
Hemoglobin: 7.9 g/dL — ABNORMAL LOW (ref 13.0–17.0)

## 2018-07-04 LAB — TYPE AND SCREEN
ABO/RH(D): A NEG
Antibody Screen: NEGATIVE
UNIT DIVISION: 0

## 2018-07-04 LAB — TROPONIN I: Troponin I: <0.03 ng/mL (ref <0.03)

## 2018-07-04 LAB — COMPREHENSIVE METABOLIC PANEL
ALT: 28 U/L (ref 0–44)
AST: 21 U/L (ref 15–41)
Albumin: 2.1 g/dL — ABNORMAL LOW (ref 3.5–5.0)
Alkaline Phosphatase: 61 U/L (ref 38–126)
Anion gap: 3 — ABNORMAL LOW (ref 5–15)
BUN: 44 mg/dL — ABNORMAL HIGH (ref 8–23)
CHLORIDE: 108 mmol/L (ref 98–111)
CO2: 36 mmol/L — ABNORMAL HIGH (ref 22–32)
Calcium: 8.1 mg/dL — ABNORMAL LOW (ref 8.9–10.3)
Creatinine, Ser: 1.07 mg/dL (ref 0.61–1.24)
GFR calc Af Amer: >60 mL/min (ref >60)
Glucose, Bld: 87 mg/dL (ref 70–99)
Potassium: 4 mmol/L (ref 3.5–5.1)
Sodium: 147 mmol/L — ABNORMAL HIGH (ref 135–145)
Total Bilirubin: 0.7 mg/dL (ref 0.3–1.2)
Total Protein: 4.9 g/dL — ABNORMAL LOW (ref 6.5–8.1)

## 2018-07-04 LAB — IRON AND TIBC
Iron: 69 ug/dL (ref 45–182)
Saturation Ratios: 41 % — ABNORMAL HIGH (ref 17.9–39.5)
TIBC: 167 ug/dL — ABNORMAL LOW (ref 250–450)
UIBC: 98 ug/dL

## 2018-07-04 LAB — FERRITIN: FERRITIN: 272 ng/mL (ref 24–336)

## 2018-07-04 LAB — FOLATE: FOLATE: 26 ng/mL (ref >5.9)

## 2018-07-04 NOTE — Progress Notes (Signed)
Pulmonary Critical Care Medicine [place]   PULMONARY CRITICAL CARE SERVICE  PROGRESS NOTE  Date of Service: 07/04/2018  Rick Moyer  XFG:[id number]  DOB: 1955/11/02   DOA: 06/11/2018  Referring Physician: Merton Border, MD  HPI: Rick Moyer is a 62 y.o. male seen for follow up of Acute on Chronic Respiratory Failure.  Currently is on pressure support mode on 50% and he is awake and alert doing better.  Yesterday had a rapid response which he had recovery.  Felt that it might be due to medications however there is other confounding factors including thrombocytopenia along with blood loss so it is possible certainly that patient could have become hypotensive also there is concern for cardiac cause cardiology has seen the patient and their recommendations have been duly noted  Medications: Reviewed on Rounds  Physical Exam:  Vitals: Temperature 98.0 pulse 103 respiratory rate 22 blood pressure 102/62 saturations 100%  Ventilator Settings mode of ventilation pressure support FiO2 50% tidal volume 410 pressure support 12 PEEP 5  . General: Comfortable at this time . Eyes: Grossly normal lids, irises & conjunctiva . ENT: grossly tongue is normal . Neck: no obvious mass . Cardiovascular: S1 S2 normal no gallop . Respiratory: No rhonchi or rales are noted at this time . Abdomen: soft . Skin: no rash seen on limited exam . Musculoskeletal: not rigid . Psychiatric:unable to assess . Neurologic: no seizure no involuntary movements         Lab Data:   Basic Metabolic Panel: Recent Labs  Lab 06/28/18 0552 06/29/18 0652 06/30/18 0530 07/01/18 0701 07/04/18 0633  NA 148*  --   --  147* 147*  K 3.0* 3.4* 3.7 3.7 4.0  CL 106  --   --  105 108  CO2 35*  --   --  36* 36*  GLUCOSE 128*  --   --  133* 87  BUN 38*  --   --  43* 44*  CREATININE 0.83  --   --  0.73 1.07  CALCIUM 8.0*  --   --  8.4* 8.1*    ABG: Recent Labs  Lab 07/01/18 1000  PHART  7.287*  PCO2ART 80.7*  PO2ART 120*  HCO3 37.3*  O2SAT 98.3    Liver Function Tests: Recent Labs  Lab 07/04/18 0633  AST 21  ALT 28  ALKPHOS 61  BILITOT 0.7  PROT 4.9*  ALBUMIN 2.1*   No results for input(s): LIPASE, AMYLASE in the last 168 hours. No results for input(s): AMMONIA in the last 168 hours.  CBC: Recent Labs  Lab 07/03/18 0455 07/03/18 1307 07/03/18 1847 07/04/18 0633  WBC 4.7  --   --  5.2  HGB 7.5* 7.2* 7.8* 7.9*  7.9*  HCT 25.3* 24.6* 26.4* 26.6*  26.8*  MCV 106.8*  --   --  105.6*  PLT 81*  --   --  88*    Cardiac Enzymes: Recent Labs  Lab 07/04/18 1055  TROPONINI <0.03    BNP (last 3 results) Recent Labs    06/30/18 1051  BNP 204.7*    ProBNP (last 3 results) No results for input(s): PROBNP in the last 8760 hours.  Radiological Exams: Ir Fluoro Rm 30-60 Min  Result Date: 07/02/2018 INDICATION: Dysphagia. Request made for percutaneous gastrostomy tube placement for enteric nutrition supplementation purposes. EXAM: PULL TROUGH GASTROSTOMY TUBE PLACEMENT COMPARISON:  Chest CT-06/23/2018 MEDICATIONS: Ancef 2 gm IV; Antibiotics were administered within 1 hour of the procedure. Glucagon  1 mg IV CONTRAST:  None ANESTHESIA/SEDATION: Moderate (conscious) sedation was employed during this procedure. A total of Versed 1 mg and Fentanyl 25 mcg was administered intravenously. Moderate Sedation Time: 14 minutes. The patient's level of consciousness and vital signs were monitored continuously by radiology nursing throughout the procedure under my direct supervision. FLUOROSCOPY TIME:  3 minutes (15 mGy) COMPLICATIONS: None immediate. PROCEDURE: Informed written consent was obtained from patient's family following explanation of the procedure, risks, benefits and alternatives. A time out was performed prior to the initiation of the procedure. Ultrasound scanning was performed to demarcate the edge of the left lobe of the liver. Maximal barrier sterile  technique utilized including caps, mask, sterile gowns, sterile gloves, large sterile drape, hand hygiene and Betadine prep. The left upper quadrant was sterilely prepped and draped. An oral gastric catheter was inserted into the stomach under fluoroscopy. Despite adequate gaseous distention of the stomach, a safe percutaneous window could not be obtained secondary to interposed transverse colon as demonstrated on preceding chest CT. FINDINGS: Despite adequate gaseous distention of the stomach, a safe percutaneous window could not be obtained secondary to interposed transverse colon as demonstrated on preceding chest CT. As such, no attempt was made for placement of a percutaneous gastrostomy tube. IMPRESSION: No safe percutaneous window for gastrostomy tube placement secondary to interposed / overlying transverse colon. If gastrostomy tube placement is still desired, surgical consultation is recommended. Electronically Signed   By: Sandi Mariscal M.D.   On: 07/02/2018 16:04    Assessment/Plan Active Problems:   Acute on chronic respiratory failure with hypoxia (HCC)   COPD, very severe (HCC)   Bilateral lower extremity edema   Chronic congestive heart failure (HCC)   Cardiac arrest (HCC)   Bilateral pneumonia   1. Acute on chronic respiratory failure with hypoxia patient is doing better now on pressure support titrate oxygen down as tolerated hopefully by the weekend we might be able to get him back on the T collar wean 2. Status post cardiac arrest right now rhythm is stable we will continue to monitor 3. Severe COPD at baseline we will continue present therapy. 4. Bilateral edema diuresis as tolerated 5. Bilateral pneumonia treated we will continue with supportive care 6. Cardiac failure cardiology recommendations noted   I have personally seen and evaluated the patient, evaluated laboratory and imaging results, formulated the assessment and plan and placed orders. The Patient requires high  complexity decision making for assessment and support.  Case was discussed on Rounds with the Respiratory Therapy Staff  Allyne Gee, MD [facility] Pulmonary Critical Care Medicine Sleep Medicine
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
## 2018-07-05 ENCOUNTER — Institutional Professional Consult (permissible substitution) (HOSPITAL_COMMUNITY): Payer: Medicare Other

## 2018-07-05 DIAGNOSIS — I469 Cardiac arrest, cause unspecified: Secondary | ICD-10-CM | POA: Diagnosis not present

## 2018-07-05 DIAGNOSIS — J189 Pneumonia, unspecified organism: Secondary | ICD-10-CM | POA: Diagnosis not present

## 2018-07-05 DIAGNOSIS — R600 Localized edema: Secondary | ICD-10-CM | POA: Diagnosis not present

## 2018-07-05 DIAGNOSIS — D649 Anemia, unspecified: Secondary | ICD-10-CM

## 2018-07-05 DIAGNOSIS — R195 Other fecal abnormalities: Secondary | ICD-10-CM

## 2018-07-05 DIAGNOSIS — J9621 Acute and chronic respiratory failure with hypoxia: Secondary | ICD-10-CM | POA: Diagnosis not present

## 2018-07-05 LAB — CBC
HCT: 28.6 % — ABNORMAL LOW (ref 39.0–52.0)
Hemoglobin: 8.4 g/dL — ABNORMAL LOW (ref 13.0–17.0)
MCH: 31.1 pg (ref 26.0–34.0)
MCHC: 29.4 g/dL — ABNORMAL LOW (ref 30.0–36.0)
MCV: 105.9 fL — ABNORMAL HIGH (ref 78.0–100.0)
Platelets: 83 10*3/uL — ABNORMAL LOW (ref 150–400)
RBC: 2.7 MIL/uL — AB (ref 4.22–5.81)
RDW: 15.7 % — ABNORMAL HIGH (ref 11.5–15.5)
WBC: 4.7 10*3/uL (ref 4.0–10.5)

## 2018-07-05 LAB — RETICULOCYTES
RBC.: 2.6 MIL/uL — ABNORMAL LOW (ref 4.22–5.81)
RETIC COUNT ABSOLUTE: 83.2 10*3/uL (ref 19.0–186.0)
Retic Ct Pct: 3.2 % — ABNORMAL HIGH (ref 0.4–3.1)

## 2018-07-05 LAB — TROPONIN I: Troponin I: <0.03 ng/mL (ref <0.03)

## 2018-07-05 LAB — VITAMIN B12: Vitamin B-12: 515 pg/mL (ref 180–914)

## 2018-07-05 NOTE — Progress Notes (Signed)
Pulmonary Critical Care Medicine [place]   PULMONARY CRITICAL CARE SERVICE  PROGRESS NOTE  Date of Service: 07/05/2018  Rick Moyer  BSJ:[id number]  DOB: 64/10/1955   DOA: 06/11/2018  Referring Physician: Merton Border, MD  HPI: Rick Moyer is a 62 y.o. male seen for follow up of Acute on Chronic Respiratory Failure.  This morning was resting comfortably has not yet been started on weaning has been on pressure support but has excellent tidal volumes.  He is actually asleep and seems to be much more calm when he is asleep.  If he could remain this way during his weaning process I think we would be able to get him off rather quickly.  Medications: Reviewed on Rounds  Physical Exam:  Vitals: Temperature 97.0 pulse 83 respiratory rate 13 blood pressure 137/90 saturations 97%  Ventilator Settings mode of ventilation pressure support FiO2 50% tidal volume 900 pressure support 12 PEEP 5  . General: Comfortable at this time . Eyes: Grossly normal lids, irises & conjunctiva . ENT: grossly tongue is normal . Neck: no obvious mass . Cardiovascular: S1 S2 normal no gallop . Respiratory: No rhonchi no rales are noted at this time. . Abdomen: soft . Skin: no rash seen on limited exam . Musculoskeletal: not rigid . Psychiatric:unable to assess . Neurologic: no seizure no involuntary movements         Lab Data:   Basic Metabolic Panel: Recent Labs  Lab 06/29/18 0652 06/30/18 0530 07/01/18 0701 07/04/18 0633  NA  --   --  147* 147*  K 3.4* 3.7 3.7 4.0  CL  --   --  105 108  CO2  --   --  36* 36*  GLUCOSE  --   --  133* 87  BUN  --   --  43* 44*  CREATININE  --   --  0.73 1.07  CALCIUM  --   --  8.4* 8.1*    ABG: Recent Labs  Lab 07/01/18 1000  PHART 7.287*  PCO2ART 80.7*  PO2ART 120*  HCO3 37.3*  O2SAT 98.3    Liver Function Tests: Recent Labs  Lab 07/04/18 0633  AST 21  ALT 28  ALKPHOS 61  BILITOT 0.7  PROT 4.9*  ALBUMIN 2.1*    No results for input(s): LIPASE, AMYLASE in the last 168 hours. No results for input(s): AMMONIA in the last 168 hours.  CBC: Recent Labs  Lab 07/03/18 0455 07/03/18 1307 07/03/18 1847 07/04/18 0633 07/05/18 0649  WBC 4.7  --   --  5.2 4.7  HGB 7.5* 7.2* 7.8* 7.9*  7.9* 8.4*  HCT 25.3* 24.6* 26.4* 26.6*  26.8* 28.6*  MCV 106.8*  --   --  105.6* 105.9*  PLT 81*  --   --  88* 83*    Cardiac Enzymes: Recent Labs  Lab 07/04/18 1055 07/04/18 1631 07/04/18 2236  TROPONINI <0.03 <0.03 <0.03    BNP (last 3 results) Recent Labs    06/30/18 1051  BNP 204.7*    ProBNP (last 3 results) No results for input(s): PROBNP in the last 8760 hours.  Radiological Exams: Dg Abd Portable 1v  Result Date: 07/05/2018 CLINICAL DATA:  Nasogastric tube placement. EXAM: PORTABLE ABDOMEN - 1 VIEW COMPARISON:  06/12/2018. FINDINGS: NG tube noted with tip in the stomach. Mild colonic distention. Left base atelectasis/consolidation. Small bilateral pleural effusions cannot be excluded. IMPRESSION: 1.  NG tube noted with tip in the stomach.  Mild colonic distention. 2.  Left base atelectasis/consolidation. Small bilateral pleural effusions. Electronically Signed   By: Marcello Moores  Register   On: 07/05/2018 07:43    Assessment/Plan Active Problems:   Acute on chronic respiratory failure with hypoxia (HCC)   COPD, very severe (HCC)   Bilateral lower extremity edema   Chronic congestive heart failure ([place])   Cardiac arrest (HCC)   Bilateral pneumonia   1. Acute on chronic respiratory failure with hypoxia we will continue with the weaning process patient is going to start back on T collar today after rounds continue with pulmonary toilet supportive care 2. COPD severe we will continue with supportive care 3. Bilateral edema diuresis as tolerated 4. Chronic congestive heart failure cardiology following 5. Cardiac arrest rhythm has been stable cardiology following 6. Bilateral pneumonia treated we  will continue with present management   I have personally seen and evaluated the patient, evaluated laboratory and imaging results, formulated the assessment and plan and placed orders. The Patient requires high complexity decision making for assessment and support.  Case was discussed on Rounds with the Respiratory Therapy Staff  Allyne Gee, MD [facility] Pulmonary Critical Care Medicine Sleep Medicine

## 2018-07-05 NOTE — Progress Notes (Signed)
     [facility] Progress Note  CC:  I want to drink  Assessment / Plan: Progressive macrocytic anemia while on anticoagulation without overt GI bleeding Heme + stools earlier in this hospitalization Enteral feeds though NG    - no percutaneous window for gastrostomy tube placement in IR 07/02/18    - will need surgical consultation if gastrostomy tube is needed Progressive thrombocytopenia Acute on chronic respiratory failure requiring tracheostomy    - rapid response called 07/03/18 when patient was unresponsive Severe COPD Chronic congestive heart failure Recent PEA arrest No prior colon cancer screening (per patient) History of HCV Hepatic steatosis on ultrasound  Hemoglobin remains stable. No overt bleeding noted. Endoscopy is high risk at this time. Recommend continued hgb/hct with transfusion as indicated. Agree with IV PPI. Could consider endoscopy close to discharge when he has clinically stabilized.  GI will move to stand-by. Please call the on-call gastroenterologist with any additional questions or concerns during this hospitalization. He would like to follow-up with his gastroenterologist in [place] on discharge.   Subjective: Thirsty. Wants to eat. GI ROS is otherwise negative. No family present at the time of my evaluation.   Objective:  Vital signs in last 24 hours:     General:   Alert,  Well-developed, in NAD. NG tube present in the right nare. No blood in the oropharynx. Heart:  Regular rate and rhythm; no murmurs Pulm: Clear anteriorly with adequate air movement Abdomen:  Soft, central obesity, few ecchymoses on the abdominal wall, nontender and nondistended. Normal bowel sounds. Extremities:  Without edema. Neurologic:  Alert and  oriented x4;  grossly normal neurologically. Psych:  Alert and cooperative. Normal mood and affect.  Intake/Output from previous day: No intake/output data recorded. Intake/Output this shift: No intake/output data  recorded.  Lab Results: Recent Labs    07/03/18 0455  07/03/18 1847 07/04/18 0633 07/05/18 0649  WBC 4.7  --   --  5.2 4.7  HGB 7.5*   < > 7.8* 7.9*  7.9* 8.4*  HCT 25.3*   < > 26.4* 26.6*  26.8* 28.6*  PLT 81*  --   --  88* 83*   < > = values in this interval not displayed.   BMET Recent Labs    07/04/18 0633  NA 147*  K 4.0  CL 108  CO2 36*  GLUCOSE 87  BUN 44*  CREATININE 1.07  CALCIUM 8.1*   LFT Recent Labs    07/04/18 0633  PROT 4.9*  ALBUMIN 2.1*  AST 21  ALT 28  ALKPHOS 61  BILITOT 0.7   Dg Abd Portable 1v  Result Date: 07/05/2018 CLINICAL DATA:  Nasogastric tube placement. EXAM: PORTABLE ABDOMEN - 1 VIEW COMPARISON:  06/12/2018. FINDINGS: NG tube noted with tip in the stomach. Mild colonic distention. Left base atelectasis/consolidation. Small bilateral pleural effusions cannot be excluded. IMPRESSION: 1.  NG tube noted with tip in the stomach.  Mild colonic distention. 2. Left base atelectasis/consolidation. Small bilateral pleural effusions. Electronically Signed   By: Marcello Moores  Register   On: 07/05/2018 07:43      LOS: 0 days   [place]  07/05/2018, 11:32 AM

## 2018-07-06 DIAGNOSIS — J189 Pneumonia, unspecified organism: Secondary | ICD-10-CM | POA: Diagnosis not present

## 2018-07-06 DIAGNOSIS — R600 Localized edema: Secondary | ICD-10-CM | POA: Diagnosis not present

## 2018-07-06 DIAGNOSIS — J9621 Acute and chronic respiratory failure with hypoxia: Secondary | ICD-10-CM | POA: Diagnosis not present

## 2018-07-06 DIAGNOSIS — I469 Cardiac arrest, cause unspecified: Secondary | ICD-10-CM | POA: Diagnosis not present

## 2018-07-06 NOTE — Progress Notes (Signed)
Pulmonary Critical Care Medicine [place]   PULMONARY CRITICAL CARE SERVICE  PROGRESS NOTE  Date of Service: 07/06/2018  Rick Moyer  XLK:[id number]  DOB: 10/21/1955   DOA: 06/11/2018  Referring Physician: Merton Border, MD  HPI: Rick Moyer is a 62 y.o. male seen for follow up of Acute on Chronic Respiratory Failure. Currently his pressure support referred to 50%  Medications: Reviewed on Rounds  Physical Exam:  Vitals: temperature 98.6 pulse 72 history 20 blood pressure 66 saturations 97%  Ventilator Settings currently is on pressure support mode affect 50% 0.4 cm per 12 5  . General: Comfortable at this time . Eyes: Grossly normal lids, irises & conjunctiva . ENT: grossly tongue is normal . Neck: no obvious mass . Cardiovascular: S1 S2 normal no gallop . Respiratory: no rhonchi or rales are noted at this time . Abdomen: soft . Skin: no rash seen on limited exam . Musculoskeletal: not rigid . Psychiatric:unable to assess . Neurologic: no seizure no involuntary movements         Lab Data:   Basic Metabolic Panel: Recent Labs  Lab 06/30/18 0530 07/01/18 0701 07/04/18 0633  NA  --  147* 147*  K 3.7 3.7 4.0  CL  --  105 108  CO2  --  36* 36*  GLUCOSE  --  133* 87  BUN  --  43* 44*  CREATININE  --  0.73 1.07  CALCIUM  --  8.4* 8.1*    ABG: Recent Labs  Lab 07/01/18 1000  PHART 7.287*  PCO2ART 80.7*  PO2ART 120*  HCO3 37.3*  O2SAT 98.3    Liver Function Tests: Recent Labs  Lab 07/04/18 0633  AST 21  ALT 28  ALKPHOS 61  BILITOT 0.7  PROT 4.9*  ALBUMIN 2.1*   No results for input(s): LIPASE, AMYLASE in the last 168 hours. No results for input(s): AMMONIA in the last 168 hours.  CBC: Recent Labs  Lab 07/03/18 0455 07/03/18 1307 07/03/18 1847 07/04/18 0633 07/05/18 0649  WBC 4.7  --   --  5.2 4.7  HGB 7.5* 7.2* 7.8* 7.9*  7.9* 8.4*  HCT 25.3* 24.6* 26.4* 26.6*  26.8* 28.6*  MCV 106.8*  --   --  105.6*  105.9*  PLT 81*  --   --  88* 83*    Cardiac Enzymes: Recent Labs  Lab 07/04/18 1055 07/04/18 1631 07/04/18 2236  TROPONINI <0.03 <0.03 <0.03    BNP (last 3 results) Recent Labs    06/30/18 1051  BNP 204.7*    ProBNP (last 3 results) No results for input(s): PROBNP in the last 8760 hours.  Radiological Exams: Dg Abd Portable 1v  Result Date: 07/05/2018 CLINICAL DATA:  Nasogastric tube placement. EXAM: PORTABLE ABDOMEN - 1 VIEW COMPARISON:  06/12/2018. FINDINGS: NG tube noted with tip in the stomach. Mild colonic distention. Left base atelectasis/consolidation. Small bilateral pleural effusions cannot be excluded. IMPRESSION: 1.  NG tube noted with tip in the stomach.  Mild colonic distention. 2. Left base atelectasis/consolidation. Small bilateral pleural effusions. Electronically Signed   By: Marcello Moores  Register   On: 07/05/2018 07:43    Assessment/Plan Active Problems:   Acute on chronic respiratory failure with hypoxia (HCC)   COPD, very severe (HCC)   Bilateral lower extremity edema   Chronic congestive heart failure (HCC)   Cardiac arrest (HCC)   Bilateral pneumonia   Anemia   Heme + stool   1. Acute clinical history failure with hypoxia we'll  continue with the pressure support wean I think we should start to color soon 2. COPD severe disease continue with present therapy. 3. Bilateral edema and diuresis tolerated 4. Clinical congestive heart failure continue to follow 5. Cardiac arrest rhythm is stable continue present therapy 6. Bilateral pneumonia treated   I have personally seen and evaluated the patient, evaluated laboratory and imaging results, formulated the assessment and plan and placed orders. The Patient requires high complexity decision making for assessment and support.  Case was discussed on Rounds with the Respiratory Therapy Staff  Allyne Gee, MD [facility] Pulmonary Critical Care Medicine Sleep Medicine

## 2018-07-07 DIAGNOSIS — D649 Anemia, unspecified: Secondary | ICD-10-CM

## 2018-07-07 DIAGNOSIS — R600 Localized edema: Secondary | ICD-10-CM | POA: Diagnosis not present

## 2018-07-07 DIAGNOSIS — J9621 Acute and chronic respiratory failure with hypoxia: Secondary | ICD-10-CM | POA: Diagnosis not present

## 2018-07-07 DIAGNOSIS — J189 Pneumonia, unspecified organism: Secondary | ICD-10-CM | POA: Diagnosis not present

## 2018-07-07 NOTE — Progress Notes (Signed)
Pulmonary Critical Care Medicine [place]   PULMONARY CRITICAL CARE SERVICE  PROGRESS NOTE  Date of Service: 07/07/2018  Rick Moyer  ATF:[id number]  DOB: October 31, 1955   DOA: 06/11/2018  Referring Physician: Merton Border, MD  HPI: Rick Moyer is a 62 y.o. male seen for follow up of Acute on Chronic Respiratory Failure.  Patient is doing little bit better is on pressure support this morning has excellent tidal volumes greater than 900 cc.  This appears to be the case when he is resting and is sleeping.  When he is more awake he actually gets a little bit more agitated and I think that he is probably causing his airway down and he is trying to speak over the ventilator.  He will be placed on T-bar today weaning attempt to be made.  Medications: Reviewed on Rounds  Physical Exam:  Vitals: Temperature 98.2 pulse 74 respiratory rate 21 blood pressure 121/62 saturations 96%  Ventilator Settings currently is all pressure support tidal volume 891 FiO2 50% per support 12 PEEP 5  . General: Comfortable at this time . Eyes: Grossly normal lids, irises & conjunctiva . ENT: grossly tongue is normal . Neck: no obvious mass . Cardiovascular: S1 S2 normal no gallop . Respiratory: No rhonchi or rales are noted at this time . Abdomen: soft . Skin: no rash seen on limited exam . Musculoskeletal: not rigid . Psychiatric:unable to assess . Neurologic: no seizure no involuntary movements         Lab Data:   Basic Metabolic Panel: Recent Labs  Lab 07/01/18 0701 07/04/18 0633  NA 147* 147*  K 3.7 4.0  CL 105 108  CO2 36* 36*  GLUCOSE 133* 87  BUN 43* 44*  CREATININE 0.73 1.07  CALCIUM 8.4* 8.1*    ABG: Recent Labs  Lab 07/01/18 1000  PHART 7.287*  PCO2ART 80.7*  PO2ART 120*  HCO3 37.3*  O2SAT 98.3    Liver Function Tests: Recent Labs  Lab 07/04/18 0633  AST 21  ALT 28  ALKPHOS 61  BILITOT 0.7  PROT 4.9*  ALBUMIN 2.1*   No results for  input(s): LIPASE, AMYLASE in the last 168 hours. No results for input(s): AMMONIA in the last 168 hours.  CBC: Recent Labs  Lab 07/03/18 0455 07/03/18 1307 07/03/18 1847 07/04/18 0633 07/05/18 0649  WBC 4.7  --   --  5.2 4.7  HGB 7.5* 7.2* 7.8* 7.9*  7.9* 8.4*  HCT 25.3* 24.6* 26.4* 26.6*  26.8* 28.6*  MCV 106.8*  --   --  105.6* 105.9*  PLT 81*  --   --  88* 83*    Cardiac Enzymes: Recent Labs  Lab 07/04/18 1055 07/04/18 1631 07/04/18 2236  TROPONINI <0.03 <0.03 <0.03    BNP (last 3 results) Recent Labs    06/30/18 1051  BNP 204.7*    ProBNP (last 3 results) No results for input(s): PROBNP in the last 8760 hours.  Radiological Exams: No results found.  Assessment/Plan Active Problems:   Acute on chronic respiratory failure with hypoxia (HCC)   COPD, very severe (HCC)   Bilateral lower extremity edema   Chronic congestive heart failure (HCC)   Cardiac arrest (HCC)   Bilateral pneumonia   Anemia   Heme + stool   1. Acute on chronic respiratory failure with hypoxia we will continue with the weaning will place on T collar today hopefully continue to advance as tolerated. 2. 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  We will continue with supportive care he would actually benefit from a longer trach however that is not available at this time. 3. Bilateral lower extremity edema diuresis as tolerated. 4. Chronic congestive heart failure following cardiology continue with supportive care 5. Cardiac arrest rhythm is stable 6. Bilateral pneumonia treated follow-up x-ray as necessary   I have personally seen and evaluated the patient, evaluated laboratory and imaging results, formulated the assessment and plan and placed orders. The Patient requires high complexity decision making for assessment and support.  Case was discussed on Rounds with the Respiratory Therapy Staff time spent 35 minutes review of chart discussion with  the treatment care team  Allyne Gee, MD [facility] Pulmonary Critical Care Medicine Sleep Medicine

## 2018-07-08 DIAGNOSIS — I469 Cardiac arrest, cause unspecified: Secondary | ICD-10-CM | POA: Diagnosis not present

## 2018-07-08 DIAGNOSIS — J9621 Acute and chronic respiratory failure with hypoxia: Secondary | ICD-10-CM | POA: Diagnosis not present

## 2018-07-08 DIAGNOSIS — R600 Localized edema: Secondary | ICD-10-CM | POA: Diagnosis not present

## 2018-07-08 DIAGNOSIS — J189 Pneumonia, unspecified organism: Secondary | ICD-10-CM | POA: Diagnosis not present

## 2018-07-08 NOTE — Progress Notes (Signed)
Pulmonary Critical Care Medicine [place]   PULMONARY CRITICAL CARE SERVICE  PROGRESS NOTE  Date of Service: 07/08/2018  Rick Moyer  OFB:[id number]  DOB: 11-30-1955   DOA: 06/11/2018  Referring Physician: Merton Border, MD  HPI: Rick Moyer is a 62 y.o. male seen for follow up of Acute on Chronic Respiratory Failure.  Patient is actually sitting on the edge of the bed had a T collar on he was on 50% oxygen did not seem to be in any distress  Medications: Reviewed on Rounds  Physical Exam:  Vitals: Temperature 97.5 pulse 82 respiratory rate 23 blood pressure 116/59 saturations 95%  Ventilator Settings off the ventilator on T collar  . General: Comfortable at this time . Eyes: Grossly normal lids, irises & conjunctiva . ENT: grossly tongue is normal . Neck: no obvious mass . Cardiovascular: S1 S2 normal no gallop . Respiratory: Coarse breath sounds noted bilaterally . Abdomen: soft . Skin: no rash seen on limited exam . Musculoskeletal: not rigid . Psychiatric:unable to assess . Neurologic: no seizure no involuntary movements         Lab Data:   Basic Metabolic Panel: Recent Labs  Lab 07/04/18 0633  NA 147*  K 4.0  CL 108  CO2 36*  GLUCOSE 87  BUN 44*  CREATININE 1.07  CALCIUM 8.1*    ABG: No results for input(s): PHART, PCO2ART, PO2ART, HCO3, O2SAT in the last 168 hours.  Liver Function Tests: Recent Labs  Lab 07/04/18 0633  AST 21  ALT 28  ALKPHOS 61  BILITOT 0.7  PROT 4.9*  ALBUMIN 2.1*   No results for input(s): LIPASE, AMYLASE in the last 168 hours. No results for input(s): AMMONIA in the last 168 hours.  CBC: Recent Labs  Lab 07/03/18 0455 07/03/18 1307 07/03/18 1847 07/04/18 0633 07/05/18 0649  WBC 4.7  --   --  5.2 4.7  HGB 7.5* 7.2* 7.8* 7.9*  7.9* 8.4*  HCT 25.3* 24.6* 26.4* 26.6*  26.8* 28.6*  MCV 106.8*  --   --  105.6* 105.9*  PLT 81*  --   --  88* 83*    Cardiac Enzymes: Recent Labs  Lab  07/04/18 1055 07/04/18 1631 07/04/18 2236  TROPONINI <0.03 <0.03 <0.03    BNP (last 3 results) Recent Labs    06/30/18 1051  BNP 204.7*    ProBNP (last 3 results) No results for input(s): PROBNP in the last 8760 hours.  Radiological Exams: No results found.  Assessment/Plan Active Problems:   Acute on chronic respiratory failure with hypoxia (HCC)   COPD, very severe (HCC)   Bilateral lower extremity edema   Chronic congestive heart failure (HCC)   Cardiac arrest (HCC)   Bilateral pneumonia   Anemia   Heme + stool   1. Acute on chronic respiratory failure with hypoxia we will continue with T collar trials continue aggressive pulmonary toilet titrate oxygen down as tolerated.  Spoke with respiratory therapy on rounds with no changes trach out today and try to advance as tolerated. 2. Severe COPD at baseline continue present therapy. 3. Bilateral edema diuresis as tolerated 4. Bilateral pneumonia treated we will continue to follow 5. Chronic congestive heart failure right now is compensated we will continue with supportive care 6. Cardiac arrest rhythm has been stable we will continue to monitor.   I have personally seen and evaluated the patient, evaluated laboratory and imaging results, formulated the assessment and plan and placed orders. The Patient requires  high complexity decision making for assessment and support.  Case was discussed on Rounds with the Respiratory Therapy Staff  Allyne Gee, MD [facility] Pulmonary Critical Care Medicine Sleep Medicine

## 2018-07-09 DIAGNOSIS — R600 Localized edema: Secondary | ICD-10-CM | POA: Diagnosis not present

## 2018-07-09 DIAGNOSIS — J189 Pneumonia, unspecified organism: Secondary | ICD-10-CM | POA: Diagnosis not present

## 2018-07-09 DIAGNOSIS — D649 Anemia, unspecified: Secondary | ICD-10-CM | POA: Diagnosis not present

## 2018-07-09 DIAGNOSIS — J9621 Acute and chronic respiratory failure with hypoxia: Secondary | ICD-10-CM | POA: Diagnosis not present

## 2018-07-09 NOTE — Progress Notes (Signed)
Pulmonary Critical Care Medicine [place]   PULMONARY CRITICAL CARE SERVICE  PROGRESS NOTE  Date of Service: 07/09/2018  Rick Moyer  EXB:[id number]  DOB: July 11, 1956   DOA: 06/11/2018  Referring Physician: Merton Border, MD  HPI: Rick Moyer is a 62 y.o. male seen for follow up of Acute on Chronic Respiratory Failure.  Had another lengthy conversation with the patient's family and the patient in the room explained to them that yesterday he was able to do T collar almost the entire day in addition he also did tolerate capping.  This morning he had some desaturations and was therefore increased to 60% FiO2.  I let them know that this would indicate that there is some potential for him to wean however it is not going to likely occur during this hospitalization and he would be in a good position to continue at a skilled nursing facility.  Basically the family does not want him to leave our facility and the brother said that "if he refuses to go there is nothing that we can do to make him leave" I have asked that he discuss this with case management and administration and unfortunately I do not have control of this.  We also discussed the possibility of having him do a swallowing study because that it seems the nasogastric tube will be the biggest Toradol as far as getting him to a skilled nursing facility of their choice.  I spoke with speech therapy and they will try to schedule a modified barium swallow tomorrow  Medications: Reviewed on Rounds  Physical Exam:  Vitals: Temperature 97.2 pulse 87 respiratory rate 20 blood pressure 117/71 saturations 99%  Ventilator Settings currently on T collar FiO2 60%  . General: Comfortable at this time . Eyes: Grossly normal lids, irises & conjunctiva . ENT: grossly tongue is normal . Neck: no obvious mass . Cardiovascular: S1 S2 normal no gallop . Respiratory: No rhonchi or rales . Abdomen: soft . Skin: no rash seen on  limited exam . Musculoskeletal: not rigid . Psychiatric:unable to assess . Neurologic: no seizure no involuntary movements         Lab Data:   Basic Metabolic Panel: Recent Labs  Lab 07/04/18 0633  NA 147*  K 4.0  CL 108  CO2 36*  GLUCOSE 87  BUN 44*  CREATININE 1.07  CALCIUM 8.1*    ABG: No results for input(s): PHART, PCO2ART, PO2ART, HCO3, O2SAT in the last 168 hours.  Liver Function Tests: Recent Labs  Lab 07/04/18 0633  AST 21  ALT 28  ALKPHOS 61  BILITOT 0.7  PROT 4.9*  ALBUMIN 2.1*   No results for input(s): LIPASE, AMYLASE in the last 168 hours. No results for input(s): AMMONIA in the last 168 hours.  CBC: Recent Labs  Lab 07/03/18 0455 07/03/18 1307 07/03/18 1847 07/04/18 0633 07/05/18 0649  WBC 4.7  --   --  5.2 4.7  HGB 7.5* 7.2* 7.8* 7.9*  7.9* 8.4*  HCT 25.3* 24.6* 26.4* 26.6*  26.8* 28.6*  MCV 106.8*  --   --  105.6* 105.9*  PLT 81*  --   --  88* 83*    Cardiac Enzymes: Recent Labs  Lab 07/04/18 1055 07/04/18 1631 07/04/18 2236  TROPONINI <0.03 <0.03 <0.03    BNP (last 3 results) Recent Labs    06/30/18 1051  BNP 204.7*    ProBNP (last 3 results) No results for input(s): PROBNP in the last 8760 hours.  Radiological  Exams: No results found.  Assessment/Plan Active Problems:   Acute on chronic respiratory failure with hypoxia (HCC)   COPD, very severe (HCC)   Bilateral lower extremity edema   Chronic congestive heart failure (HCC)   Cardiac arrest (HCC)   Bilateral pneumonia   Anemia   Heme + stool   1. Acute on chronic respiratory failure with hypoxia we will continue with weaning on T collar have asked respiratory therapy to add a nasal cannula and to maintain his saturations equal to or greater than 86% 2. COPD severe disease with significant hypoxia we will continue with oxygen therapy 3. Chronic congestive heart failure at baseline cardiology has been asked to see the patient again 4. Bilateral pneumonia  treated we will continue to monitor. 5. Status post cardiac arrest rhythm is stable 6. Bilateral lower extremity edema diuresis tolerated   I have personally seen and evaluated the patient, evaluated laboratory and imaging results, formulated the assessment and plan and placed orders. The Patient requires high complexity decision making for assessment and support.  Case was discussed on Rounds with the Respiratory Therapy Staff time spent 35 minutes in conversation with the family as well as the treatment team and medical staff  Allyne Gee, MD [facility] Pulmonary Critical Care Medicine Sleep Medicine

## 2018-07-10 DIAGNOSIS — J9621 Acute and chronic respiratory failure with hypoxia: Secondary | ICD-10-CM | POA: Diagnosis not present

## 2018-07-10 DIAGNOSIS — D649 Anemia, unspecified: Secondary | ICD-10-CM | POA: Diagnosis not present

## 2018-07-10 DIAGNOSIS — R600 Localized edema: Secondary | ICD-10-CM | POA: Diagnosis not present

## 2018-07-10 DIAGNOSIS — J189 Pneumonia, unspecified organism: Secondary | ICD-10-CM | POA: Diagnosis not present

## 2018-07-10 LAB — BASIC METABOLIC PANEL
Anion gap: 4 — ABNORMAL LOW (ref 5–15)
BUN: 19 mg/dL (ref 8–23)
CHLORIDE: 99 mmol/L (ref 98–111)
CO2: 35 mmol/L — AB (ref 22–32)
Calcium: 8.2 mg/dL — ABNORMAL LOW (ref 8.9–10.3)
Creatinine, Ser: 0.57 mg/dL — ABNORMAL LOW (ref 0.61–1.24)
GFR calc Af Amer: >60 mL/min (ref >60)
GFR calc non Af Amer: >60 mL/min (ref >60)
Glucose, Bld: 97 mg/dL (ref 70–99)
POTASSIUM: 3.9 mmol/L (ref 3.5–5.1)
SODIUM: 138 mmol/L (ref 135–145)

## 2018-07-10 LAB — CBC
HEMATOCRIT: 28.7 % — AB (ref 39.0–52.0)
HEMOGLOBIN: 8.4 g/dL — AB (ref 13.0–17.0)
MCH: 30.3 pg (ref 26.0–34.0)
MCHC: 29.3 g/dL — ABNORMAL LOW (ref 30.0–36.0)
MCV: 103.6 fL — AB (ref 80.0–100.0)
NRBC: 0 % (ref 0.0–0.2)
Platelets: 143 10*3/uL — ABNORMAL LOW (ref 150–400)
RBC: 2.77 MIL/uL — ABNORMAL LOW (ref 4.22–5.81)
RDW: 16.9 % — ABNORMAL HIGH (ref 11.5–15.5)
WBC: 3.7 10*3/uL — AB (ref 4.0–10.5)

## 2018-07-10 NOTE — Progress Notes (Signed)
Pulmonary Critical Care Medicine [place]   PULMONARY CRITICAL CARE SERVICE  PROGRESS NOTE  Date of Service: 07/10/2018  Rick Moyer  SEG:[id number]  DOB: 09-26-1956   DOA: 06/11/2018  Referring Physician: Merton Border, MD  HPI: Rick Moyer is a 62 y.o. male seen for follow up of Acute on Chronic Respiratory Failure.  Patient is on T collar at this time 35% FiO2 and he is on 2 L nasal cannula.  He is actually been off the ventilator since yesterday doing much better.  Patient also was started on PMV and he is tolerating it fairly well.  Scheduled for a fees evaluation tomorrow  Medications: Reviewed on Rounds  Physical Exam:  Vitals: Temperature 98.2 pulse 84 respiratory rate 24 blood pressure 93/56 saturations 98%  Ventilator Settings off the ventilator on T collar 35% FiO2  . General: Comfortable at this time . Eyes: Grossly normal lids, irises & conjunctiva . ENT: grossly tongue is normal . Neck: no obvious mass . Cardiovascular: S1 S2 normal no gallop . Respiratory: No rhonchi or rales are noted . Abdomen: soft . Skin: no rash seen on limited exam . Musculoskeletal: not rigid . Psychiatric:unable to assess . Neurologic: no seizure no involuntary movements         Lab Data:   Basic Metabolic Panel: Recent Labs  Lab 07/04/18 0633 07/10/18 0529  NA 147* 138  K 4.0 3.9  CL 108 99  CO2 36* 35*  GLUCOSE 87 97  BUN 44* 19  CREATININE 1.07 0.57*  CALCIUM 8.1* 8.2*    ABG: No results for input(s): PHART, PCO2ART, PO2ART, HCO3, O2SAT in the last 168 hours.  Liver Function Tests: Recent Labs  Lab 07/04/18 0633  AST 21  ALT 28  ALKPHOS 61  BILITOT 0.7  PROT 4.9*  ALBUMIN 2.1*   No results for input(s): LIPASE, AMYLASE in the last 168 hours. No results for input(s): AMMONIA in the last 168 hours.  CBC: Recent Labs  Lab 07/03/18 1307 07/03/18 1847 07/04/18 7371 07/05/18 0649 07/10/18 0529  WBC  --   --  5.2 4.7 3.7*   HGB 7.2* 7.8* 7.9*  7.9* 8.4* 8.4*  HCT 24.6* 26.4* 26.6*  26.8* 28.6* 28.7*  MCV  --   --  105.6* 105.9* 103.6*  PLT  --   --  88* 83* 143*    Cardiac Enzymes: Recent Labs  Lab 07/04/18 1055 07/04/18 1631 07/04/18 2236  TROPONINI <0.03 <0.03 <0.03    BNP (last 3 results) Recent Labs    06/30/18 1051  BNP 204.7*    ProBNP (last 3 results) No results for input(s): PROBNP in the last 8760 hours.  Radiological Exams: No results found.  Assessment/Plan Active Problems:   Acute on chronic respiratory failure with hypoxia (HCC)   COPD, very severe (HCC)   Bilateral lower extremity edema   Chronic congestive heart failure (HCC)   Cardiac arrest (HCC)   Bilateral pneumonia   Anemia   Heme + stool   1. Acute on chronic respiratory failure with hypoxia patient is doing fine with the T collar which will be continued along with a nasal cannula supplement.  Hopefully we should be able to also start capping him soon 2. Severe COPD advanced disease continue with present management. 3. Bilateral edema clinically improved 4. Chronic congestive heart failure stable at this time we will continue with supportive care 5. Cardiac arrest at baseline we will continue to follow rhythm 6. Bilateral pneumonia treated  resolved 7. Anemia transfuse as necessary   I have personally seen and evaluated the patient, evaluated laboratory and imaging results, formulated the assessment and plan and placed orders. The Patient requires high complexity decision making for assessment and support.  Case was discussed on Rounds with the Respiratory Therapy Staff time spent 35 minutes discussion with the family medical staff and treatment team rounds  Allyne Gee, MD [facility] Pulmonary Critical Care Medicine Sleep Medicine

## 2018-07-11 ENCOUNTER — Other Ambulatory Visit (HOSPITAL_COMMUNITY): Payer: Medicare Other

## 2018-07-11 DIAGNOSIS — J9621 Acute and chronic respiratory failure with hypoxia: Secondary | ICD-10-CM | POA: Diagnosis not present

## 2018-07-11 DIAGNOSIS — I469 Cardiac arrest, cause unspecified: Secondary | ICD-10-CM | POA: Diagnosis not present

## 2018-07-11 DIAGNOSIS — J189 Pneumonia, unspecified organism: Secondary | ICD-10-CM | POA: Diagnosis not present

## 2018-07-11 DIAGNOSIS — R600 Localized edema: Secondary | ICD-10-CM | POA: Diagnosis not present

## 2018-07-11 NOTE — Progress Notes (Signed)
Pulmonary Critical Care Medicine [place]   PULMONARY CRITICAL CARE SERVICE  PROGRESS NOTE  Date of Service: 07/11/2018  Rick Moyer  QIW:[id number]  DOB: 09/20/1956   DOA: 06/11/2018  Referring Physician: Merton Border, MD  HPI: Rick Moyer is a 62 y.o. male seen for follow up of Acute on Chronic Respiratory Failure.  Patient scheduled for a swallowing study to be done today.  Overnight he stayed off of the ventilator.  Remains on 35% oxygen doing fairly well.  He is now been off the ventilator for 48 hours with supplemental 2 L nasal cannula and trach collar.  Medications: Reviewed on Rounds  Physical Exam:  Vitals: Temperature 98.1 pulse 100 respiratory rate 18 blood pressure 120/60 saturations 97%  Ventilator Settings currently is off the ventilator on T collar  . General: Comfortable at this time . Eyes: Grossly normal lids, irises & conjunctiva . ENT: grossly tongue is normal . Neck: no obvious mass . Cardiovascular: S1 S2 normal no gallop . Respiratory: No rhonchi or rales are noted at this time . Abdomen: soft . Skin: no rash seen on limited exam . Musculoskeletal: not rigid . Psychiatric:unable to assess . Neurologic: no seizure no involuntary movements         Lab Data:   Basic Metabolic Panel: Recent Labs  Lab 07/10/18 0529  NA 138  K 3.9  CL 99  CO2 35*  GLUCOSE 97  BUN 19  CREATININE 0.57*  CALCIUM 8.2*    ABG: No results for input(s): PHART, PCO2ART, PO2ART, HCO3, O2SAT in the last 168 hours.  Liver Function Tests: No results for input(s): AST, ALT, ALKPHOS, BILITOT, PROT, ALBUMIN in the last 168 hours. No results for input(s): LIPASE, AMYLASE in the last 168 hours. No results for input(s): AMMONIA in the last 168 hours.  CBC: Recent Labs  Lab 07/05/18 0649 07/10/18 0529  WBC 4.7 3.7*  HGB 8.4* 8.4*  HCT 28.6* 28.7*  MCV 105.9* 103.6*  PLT 83* 143*    Cardiac Enzymes: Recent Labs  Lab 07/04/18 2236   TROPONINI <0.03    BNP (last 3 results) Recent Labs    06/30/18 1051  BNP 204.7*    ProBNP (last 3 results) No results for input(s): PROBNP in the last 8760 hours.  Radiological Exams: No results found.  Assessment/Plan Active Problems:   Acute on chronic respiratory failure with hypoxia (HCC)   COPD, very severe (HCC)   Bilateral lower extremity edema   Chronic congestive heart failure (HCC)   Cardiac arrest (HCC)   Bilateral pneumonia   Anemia   Heme + stool   1. Acute on chronic respiratory failure with hypoxia we will continue with weaning on T collar continue with supportive care 2. COPD severe disease we will continue with present management 3. Bilateral pneumonia patient is supposed to have a swallowing assessment done we will continue with supportive care. 4. Cardiac arrest stable rhythm at this time we will continue present therapy 5. Chronic congestive heart failure rhythm is at baseline we will continue with supportive care. 6. Bilateral edema continue with present management seems to be improving gradually   I have personally seen and evaluated the patient, evaluated laboratory and imaging results, formulated the assessment and plan and placed orders. The Patient requires high complexity decision making for assessment and support.  Case was discussed on Rounds with the Respiratory Therapy Staff  Allyne Gee, MD [facility] Pulmonary Critical Care Medicine Sleep Medicine

## 2018-07-12 DIAGNOSIS — I469 Cardiac arrest, cause unspecified: Secondary | ICD-10-CM | POA: Diagnosis not present

## 2018-07-12 DIAGNOSIS — J9621 Acute and chronic respiratory failure with hypoxia: Secondary | ICD-10-CM | POA: Diagnosis not present

## 2018-07-12 DIAGNOSIS — R600 Localized edema: Secondary | ICD-10-CM | POA: Diagnosis not present

## 2018-07-12 DIAGNOSIS — J189 Pneumonia, unspecified organism: Secondary | ICD-10-CM | POA: Diagnosis not present

## 2018-07-12 NOTE — Progress Notes (Signed)
Pulmonary Critical Care Medicine [place]   PULMONARY CRITICAL CARE SERVICE  PROGRESS NOTE  Date of Service: 07/12/2018  EMMIT ORILEY  MOQ:[id number]  DOB: [birth year]   DOA: 06/11/2018  Referring Physician: Merton Border, MD  HPI: CIRO TASHIRO is a 62 y.o. male seen for follow up of Acute on Chronic Respiratory Failure.  He is doing much better in better spirits.  Is on T collar 35% FiO2 supplemented with 2 L cannula comfortable at this time passed his swallowing study and is eating  Medications: Reviewed on Rounds  Physical Exam:  Vitals: Temperature 98.0 pulse 65 respiratory rate 18 blood pressure 111/62 saturations 99%  Ventilator Settings off the ventilator on T collar FiO2 35%  . General: Comfortable at this time . Eyes: Grossly normal lids, irises & conjunctiva . ENT: grossly tongue is normal . Neck: no obvious mass . Cardiovascular: S1 S2 normal no gallop . Respiratory: No rhonchi no rales are noted at this time . Abdomen: soft . Skin: no rash seen on limited exam . Musculoskeletal: not rigid . Psychiatric:unable to assess . Neurologic: no seizure no involuntary movements         Lab Data:   Basic Metabolic Panel: Recent Labs  Lab 07/10/18 0529  NA 138  K 3.9  CL 99  CO2 35*  GLUCOSE 97  BUN 19  CREATININE 0.57*  CALCIUM 8.2*    ABG: No results for input(s): PHART, PCO2ART, PO2ART, HCO3, O2SAT in the last 168 hours.  Liver Function Tests: No results for input(s): AST, ALT, ALKPHOS, BILITOT, PROT, ALBUMIN in the last 168 hours. No results for input(s): LIPASE, AMYLASE in the last 168 hours. No results for input(s): AMMONIA in the last 168 hours.  CBC: Recent Labs  Lab 07/10/18 0529  WBC 3.7*  HGB 8.4*  HCT 28.7*  MCV 103.6*  PLT 143*    Cardiac Enzymes: No results for input(s): CKTOTAL, CKMB, CKMBINDEX, TROPONINI in the last 168 hours.  BNP (last 3 results) Recent Labs    06/30/18 1051  BNP 204.7*     ProBNP (last 3 results) No results for input(s): PROBNP in the last 8760 hours.  Radiological Exams: No results found.  Assessment/Plan Active Problems:   Acute on chronic respiratory failure with hypoxia (HCC)   COPD, very severe (HCC)   Bilateral lower extremity edema   Chronic congestive heart failure (HCC)   Cardiac arrest (HCC)   Bilateral pneumonia   Anemia   Heme + stool   1. Acute on chronic respiratory failure with hypoxia we will continue with T collar titrate oxygen down as tolerated maintain saturations greater than or equal to 85% he actually tolerates a low saturations well as he has been chronically hypoxic and in fact he does not need 100% saturations at this time. 2. Severe COPD advanced disease continue with supportive care 3. Chronic congestive heart failure at baseline we will continue to monitor 4. Cardiac arrest rhythm has been stable we will continue to monitor 5. Bilateral pneumonia clinically improving 6. Dysphagia resolved patient is eating   I have personally seen and evaluated the patient, evaluated laboratory and imaging results, formulated the assessment and plan and placed orders. The Patient requires high complexity decision making for assessment and support.  Case was discussed on Rounds with the Respiratory Therapy Staff  Allyne Gee, MD [facility] Pulmonary Critical Care Medicine Sleep Medicine

## 2018-07-14 DIAGNOSIS — R600 Localized edema: Secondary | ICD-10-CM | POA: Diagnosis not present

## 2018-07-14 DIAGNOSIS — J9621 Acute and chronic respiratory failure with hypoxia: Secondary | ICD-10-CM | POA: Diagnosis not present

## 2018-07-14 DIAGNOSIS — I469 Cardiac arrest, cause unspecified: Secondary | ICD-10-CM | POA: Diagnosis not present

## 2018-07-14 DIAGNOSIS — J189 Pneumonia, unspecified organism: Secondary | ICD-10-CM | POA: Diagnosis not present

## 2018-07-14 NOTE — Progress Notes (Signed)
Pulmonary Critical Care Medicine [place]   PULMONARY CRITICAL CARE SERVICE  PROGRESS NOTE  Date of Service: 07/14/2018  Rick Moyer  BVQ:[id number]  DOB: 12-Dec-1955   DOA: 06/11/2018  Referring Physician: Merton Border, MD  HPI: Rick Moyer is a 62 y.o. male seen for follow up of Acute on Chronic Respiratory Failure.  Comfortable without distress he has been on T collar nasal cannula is off currently is on 40% FiO2  Medications: Reviewed on Rounds  Physical Exam:  Vitals: Temperature 97.6 pulse 72 respiratory rate 16 blood pressure 114/56 saturations 95%  Ventilator Settings currently is on T collar 40% FiO2  . General: Comfortable at this time . Eyes: Grossly normal lids, irises & conjunctiva . ENT: grossly tongue is normal . Neck: no obvious mass . Cardiovascular: S1 S2 normal no gallop . Respiratory: No rhonchi or rales are noted at this time . Abdomen: soft . Skin: no rash seen on limited exam . Musculoskeletal: not rigid . Psychiatric:unable to assess . Neurologic: no seizure no involuntary movements         Lab Data:   Basic Metabolic Panel: Recent Labs  Lab 07/10/18 0529  NA 138  K 3.9  CL 99  CO2 35*  GLUCOSE 97  BUN 19  CREATININE 0.57*  CALCIUM 8.2*    ABG: No results for input(s): PHART, PCO2ART, PO2ART, HCO3, O2SAT in the last 168 hours.  Liver Function Tests: No results for input(s): AST, ALT, ALKPHOS, BILITOT, PROT, ALBUMIN in the last 168 hours. No results for input(s): LIPASE, AMYLASE in the last 168 hours. No results for input(s): AMMONIA in the last 168 hours.  CBC: Recent Labs  Lab 07/10/18 0529  WBC 3.7*  HGB 8.4*  HCT 28.7*  MCV 103.6*  PLT 143*    Cardiac Enzymes: No results for input(s): CKTOTAL, CKMB, CKMBINDEX, TROPONINI in the last 168 hours.  BNP (last 3 results) Recent Labs    06/30/18 1051  BNP 204.7*    ProBNP (last 3 results) No results for input(s): PROBNP in the last 8760  hours.  Radiological Exams: No results found.  Assessment/Plan Active Problems:   Acute on chronic respiratory failure with hypoxia (HCC)   COPD, very severe (HCC)   Bilateral lower extremity edema   Chronic congestive heart failure (HCC)   Cardiac arrest (HCC)   Bilateral pneumonia   Anemia   Heme + stool   1. Acute on chronic respiratory failure with hypoxia we will continue with gradually weaning his oxygen down should bring him back down to 35% 2. Severe COPD advanced disease continue with supportive care 3. Bilateral edema follow fluid status 4. Chronic congestive heart failure stable we will continue present therapy 5. Cardiac arrest stable rhythm at this time 6. Bilateral pneumonia treated 7. Anemia we will continue with supportive care   I have personally seen and evaluated the patient, evaluated laboratory and imaging results, formulated the assessment and plan and placed orders. The Patient requires high complexity decision making for assessment and support.  Case was discussed on Rounds with the Respiratory Therapy Staff  Allyne Gee, MD [facility] Pulmonary Critical Care Medicine Sleep Medicine

## 2018-07-15 DIAGNOSIS — J9621 Acute and chronic respiratory failure with hypoxia: Secondary | ICD-10-CM | POA: Diagnosis not present

## 2018-07-15 DIAGNOSIS — R600 Localized edema: Secondary | ICD-10-CM | POA: Diagnosis not present

## 2018-07-15 DIAGNOSIS — I469 Cardiac arrest, cause unspecified: Secondary | ICD-10-CM | POA: Diagnosis not present

## 2018-07-15 DIAGNOSIS — J189 Pneumonia, unspecified organism: Secondary | ICD-10-CM | POA: Diagnosis not present

## 2018-07-15 NOTE — Progress Notes (Signed)
Pulmonary Critical Care Medicine [place]   PULMONARY CRITICAL CARE SERVICE  PROGRESS NOTE  Date of Service: 07/15/2018  Rick Moyer  QZR:[id number]  DOB: 08-10-1956   DOA: 06/11/2018  Referring Physician: Merton Border, MD  HPI: Rick Moyer is a 62 y.o. male seen for follow up of Acute on Chronic Respiratory Failure.  Patient is comfortable with no distress at this time continues on the T collar at this time on 35% FiO2 with 2 L  Medications: Reviewed on Rounds  Physical Exam:  Vitals: Temperature 96.5 pulse 73 respiratory rate 20 blood pressure 105/65  Ventilator Settings off the ventilator on T collar  . General: Comfortable at this time . Eyes: Grossly normal lids, irises & conjunctiva . ENT: grossly tongue is normal . Neck: no obvious mass . Cardiovascular: S1 S2 normal no gallop . Respiratory: No rhonchi no rales . Abdomen: soft . Skin: no rash seen on limited exam . Musculoskeletal: not rigid . Psychiatric:unable to assess . Neurologic: no seizure no involuntary movements         Lab Data:   Basic Metabolic Panel: Recent Labs  Lab 07/10/18 0529  NA 138  K 3.9  CL 99  CO2 35*  GLUCOSE 97  BUN 19  CREATININE 0.57*  CALCIUM 8.2*    ABG: No results for input(s): PHART, PCO2ART, PO2ART, HCO3, O2SAT in the last 168 hours.  Liver Function Tests: No results for input(s): AST, ALT, ALKPHOS, BILITOT, PROT, ALBUMIN in the last 168 hours. No results for input(s): LIPASE, AMYLASE in the last 168 hours. No results for input(s): AMMONIA in the last 168 hours.  CBC: Recent Labs  Lab 07/10/18 0529  WBC 3.7*  HGB 8.4*  HCT 28.7*  MCV 103.6*  PLT 143*    Cardiac Enzymes: No results for input(s): CKTOTAL, CKMB, CKMBINDEX, TROPONINI in the last 168 hours.  BNP (last 3 results) Recent Labs    06/30/18 1051  BNP 204.7*    ProBNP (last 3 results) No results for input(s): PROBNP in the last 8760 hours.  Radiological  Exams: No results found.  Assessment/Plan Active Problems:   Acute on chronic respiratory failure with hypoxia (HCC)   COPD, very severe (HCC)   Bilateral lower extremity edema   Chronic congestive heart failure (HCC)   Cardiac arrest (HCC)   Bilateral pneumonia   Anemia   Heme + stool   1. Acute on chronic respiratory failure with hypoxia we will continue weaning him when I have the respiratory therapy turn FiO2 down if able to tolerate. 2. Severe COPD continue present management continue pulmonary toilet 3. Bilateral lower extremity edema continue with diuresis as tolerated 4. Chronic congestive heart failure follow-up on fluid status 5. Cardiac arrest rhythm has been stable 6. Bilateral pneumonia treated follow x-ray as necessary 7. Anemia follow-up on labs   I have personally seen and evaluated the patient, evaluated laboratory and imaging results, formulated the assessment and plan and placed orders. The Patient requires high complexity decision making for assessment and support.  Case was discussed on Rounds with the Respiratory Therapy Staff  Allyne Gee, MD [facility] Pulmonary Critical Care Medicine Sleep Medicine

## 2018-07-16 DIAGNOSIS — J9621 Acute and chronic respiratory failure with hypoxia: Secondary | ICD-10-CM | POA: Diagnosis not present

## 2018-07-16 DIAGNOSIS — R600 Localized edema: Secondary | ICD-10-CM | POA: Diagnosis not present

## 2018-07-16 DIAGNOSIS — J189 Pneumonia, unspecified organism: Secondary | ICD-10-CM | POA: Diagnosis not present

## 2018-07-16 DIAGNOSIS — I469 Cardiac arrest, cause unspecified: Secondary | ICD-10-CM | POA: Diagnosis not present

## 2018-07-16 NOTE — Progress Notes (Signed)
Pulmonary Critical Care Medicine [place]   PULMONARY CRITICAL CARE SERVICE  PROGRESS NOTE  Date of Service: 07/16/2018  Rick Moyer  IRC:[id number]  DOB: 06-02-1956   DOA: 06/11/2018  Referring Physician: Merton Border, MD  HPI: Rick Moyer is a 62 y.o. male seen for follow up of Acute on Chronic Respiratory Failure.  Patient is doing fine right now is on 40% oxygen remains on T collar he is getting supplemental O2 by nasal cannula.  Awaiting word on his discharge plan  Medications: Reviewed on Rounds  Physical Exam:  Vitals: Temperature 96.9 pulse 69 respiratory rate 16 blood pressure 95/53 saturation 94%  Ventilator Settings currently is on T collar FiO2 40%  . General: Comfortable at this time . Eyes: Grossly normal lids, irises & conjunctiva . ENT: grossly tongue is normal . Neck: no obvious mass . Cardiovascular: S1 S2 normal no gallop . Respiratory: No rhonchi no rales are noted at this time . Abdomen: soft . Skin: no rash seen on limited exam . Musculoskeletal: not rigid . Psychiatric:unable to assess . Neurologic: no seizure no involuntary movements         Lab Data:   Basic Metabolic Panel: Recent Labs  Lab 07/10/18 0529  NA 138  K 3.9  CL 99  CO2 35*  GLUCOSE 97  BUN 19  CREATININE 0.57*  CALCIUM 8.2*    ABG: No results for input(s): PHART, PCO2ART, PO2ART, HCO3, O2SAT in the last 168 hours.  Liver Function Tests: No results for input(s): AST, ALT, ALKPHOS, BILITOT, PROT, ALBUMIN in the last 168 hours. No results for input(s): LIPASE, AMYLASE in the last 168 hours. No results for input(s): AMMONIA in the last 168 hours.  CBC: Recent Labs  Lab 07/10/18 0529  WBC 3.7*  HGB 8.4*  HCT 28.7*  MCV 103.6*  PLT 143*    Cardiac Enzymes: No results for input(s): CKTOTAL, CKMB, CKMBINDEX, TROPONINI in the last 168 hours.  BNP (last 3 results) Recent Labs    06/30/18 1051  BNP 204.7*    ProBNP (last 3  results) No results for input(s): PROBNP in the last 8760 hours.  Radiological Exams: No results found.  Assessment/Plan Active Problems:   Acute on chronic respiratory failure with hypoxia (HCC)   COPD, very severe (HCC)   Bilateral lower extremity edema   Chronic congestive heart failure (HCC)   Cardiac arrest (HCC)   Bilateral pneumonia   Anemia   Heme + stool   1. Acute on chronic respiratory failure with hypoxia we will continue with T collar.  Oxygen requirements are up a little bit would consider getting a follow-up chest x-ray. 2. Severe COPD at baseline we will continue with present management. 3. Chronic congestive heart failure stable follow-up x-ray is recommended because of the oxygen requirements. 4. Bilateral pneumonia treated clinically resolved. 5. Cardiac arrest rhythm has been stable 6. Bilateral edema diuresis as tolerated   I have personally seen and evaluated the patient, evaluated laboratory and imaging results, formulated the assessment and plan and placed orders. The Patient requires high complexity decision making for assessment and support.  Case was discussed on Rounds with the Respiratory Therapy Staff  Allyne Gee, MD [facility] Pulmonary Critical Care Medicine Sleep Medicine

## 2018-07-17 DIAGNOSIS — I469 Cardiac arrest, cause unspecified: Secondary | ICD-10-CM | POA: Diagnosis not present

## 2018-07-17 DIAGNOSIS — R600 Localized edema: Secondary | ICD-10-CM | POA: Diagnosis not present

## 2018-07-17 DIAGNOSIS — J189 Pneumonia, unspecified organism: Secondary | ICD-10-CM | POA: Diagnosis not present

## 2018-07-17 DIAGNOSIS — J9621 Acute and chronic respiratory failure with hypoxia: Secondary | ICD-10-CM | POA: Diagnosis not present

## 2018-07-17 LAB — BLOOD GAS, ARTERIAL
Acid-Base Excess: 13 mmol/L — ABNORMAL HIGH (ref 0.0–2.0)
BICARBONATE: 39.6 mmol/L — AB (ref 20.0–28.0)
FIO2: 50
O2 Saturation: 86.4 %
PATIENT TEMPERATURE: 98.6
pCO2 arterial: 81.5 mmHg (ref 32.0–48.0)
pH, Arterial: 7.308 — ABNORMAL LOW (ref 7.350–7.450)
pO2, Arterial: 56 mmHg — ABNORMAL LOW (ref 83.0–108.0)

## 2018-07-17 NOTE — Progress Notes (Signed)
Pulmonary Critical Care Medicine [place]   PULMONARY CRITICAL CARE SERVICE  PROGRESS NOTE  Date of Service: 07/17/2018  Rick Moyer  [id number]:[id number]  DOB: October 01, 1956   DOA: 06/11/2018  Referring Physician: Merton Border, MD  HPI: Rick Moyer is a 62 y.o. male seen for follow up of Acute on Chronic Respiratory Failure.  Comfortable right now without distress.  Patient is on on T collar his oxygen requirements have gone up slightly as he is comfortable  Medications: Reviewed on Rounds  Physical Exam:  Vitals: Temperature 98.1 pulse 80 respiratory rate 18 blood pressure 148/82 saturations 91%  Ventilator Settings off the ventilator on T collar at this time  . General: Comfortable at this time . Eyes: Grossly normal lids, irises & conjunctiva . ENT: grossly tongue is normal . Neck: no obvious mass . Cardiovascular: S1 S2 normal no gallop . Respiratory: No rhonchi no rales . Abdomen: soft . Skin: no rash seen on limited exam . Musculoskeletal: not rigid . Psychiatric:unable to assess . Neurologic: no seizure no involuntary movements         Lab Data:   Basic Metabolic Panel: No results for input(s): NA, K, CL, CO2, GLUCOSE, BUN, CREATININE, CALCIUM, MG, PHOS in the last 168 hours.  ABG: No results for input(s): PHART, PCO2ART, PO2ART, HCO3, O2SAT in the last 168 hours.  Liver Function Tests: No results for input(s): AST, ALT, ALKPHOS, BILITOT, PROT, ALBUMIN in the last 168 hours. No results for input(s): LIPASE, AMYLASE in the last 168 hours. No results for input(s): AMMONIA in the last 168 hours.  CBC: No results for input(s): WBC, NEUTROABS, HGB, HCT, MCV, PLT in the last 168 hours.  Cardiac Enzymes: No results for input(s): CKTOTAL, CKMB, CKMBINDEX, TROPONINI in the last 168 hours.  BNP (last 3 results) Recent Labs    06/30/18 1051  BNP 204.7*    ProBNP (last 3 results) No results for input(s): PROBNP in the last 8760  hours.  Radiological Exams: No results found.  Assessment/Plan Active Problems:   Acute on chronic respiratory failure with hypoxia (HCC)   COPD, very severe (HCC)   Bilateral lower extremity edema   Chronic congestive heart failure (HCC)   Cardiac arrest (HCC)   Bilateral pneumonia   Anemia   Heme + stool   1. Acute respiratory failure with hypoxia we will continue to wean we will change trach over to a cuffless trach.  Continue pulmonary supportive care 2. Severe COPD advanced end-stage disease 3. Chronic congestive heart failure stable we will continue to monitor 4. Cardiac arrest rhythm is stable 5. Bilateral pneumonia treated clinically resolved 6. Lower extremity edema diuresis tolerated   I have personally seen and evaluated the patient, evaluated laboratory and imaging results, formulated the assessment and plan and placed orders. The Patient requires high complexity decision making for assessment and support.  Case was discussed on Rounds with the Respiratory Therapy Staff  Allyne Gee, MD [facility] Pulmonary Critical Care Medicine Sleep Medicine

## 2018-07-18 DIAGNOSIS — I469 Cardiac arrest, cause unspecified: Secondary | ICD-10-CM | POA: Diagnosis not present

## 2018-07-18 DIAGNOSIS — J9621 Acute and chronic respiratory failure with hypoxia: Secondary | ICD-10-CM | POA: Diagnosis not present

## 2018-07-18 DIAGNOSIS — R600 Localized edema: Secondary | ICD-10-CM | POA: Diagnosis not present

## 2018-07-18 DIAGNOSIS — J189 Pneumonia, unspecified organism: Secondary | ICD-10-CM | POA: Diagnosis not present

## 2018-07-18 LAB — BLOOD GAS, ARTERIAL
Acid-Base Excess: 14.5 mmol/L — ABNORMAL HIGH (ref 0.0–2.0)
Bicarbonate: 40.5 mmol/L — ABNORMAL HIGH (ref 20.0–28.0)
FIO2: 50
O2 CONTENT: 6 L/min
O2 SAT: 70.8 %
PCO2 ART: 72.5 mmHg — AB (ref 32.0–48.0)
PH ART: 7.366 (ref 7.350–7.450)
PO2 ART: 40.2 mmHg — AB (ref 83.0–108.0)
Patient temperature: 98.6

## 2018-07-18 NOTE — Progress Notes (Signed)
Pulmonary Critical Care Medicine [place]   PULMONARY CRITICAL CARE SERVICE  PROGRESS NOTE  Date of Service: 07/18/2018  KAEVION SINCLAIR  [id number]:[id number]  DOB: [birth year]   DOA: 06/11/2018  Referring Physician: Merton Border, MD  HPI: Rick Moyer is a 62 y.o. male seen for follow up of Acute on Chronic Respiratory Failure.  Patient is on T collar right now has been on 50% oxygen he is tolerating it well.  Spoke with respiratory therapy no signs and symptoms of aspiration and his oxygen requirements have been slowly going up  Medications: Reviewed on Rounds  Physical Exam:  Vitals: Temperature 98.4 pulse 78 respiratory rate 16 blood pressure 169/88 saturations 99%  Ventilator Settings off the ventilator on T collar at this time  . General: Comfortable at this time . Eyes: Grossly normal lids, irises & conjunctiva . ENT: grossly tongue is normal . Neck: no obvious mass . Cardiovascular: S1 S2 normal no gallop . Respiratory: No rhonchi or rales are noted . Abdomen: soft . Skin: no rash seen on limited exam . Musculoskeletal: not rigid . Psychiatric:unable to assess . Neurologic: no seizure no involuntary movements         Lab Data:   Basic Metabolic Panel: No results for input(s): NA, K, CL, CO2, GLUCOSE, BUN, CREATININE, CALCIUM, MG, PHOS in the last 168 hours.  ABG: Recent Labs  Lab 07/17/18 1800  PHART 7.308*  PCO2ART 81.5*  PO2ART 56.0*  HCO3 39.6*  O2SAT 86.4    Liver Function Tests: No results for input(s): AST, ALT, ALKPHOS, BILITOT, PROT, ALBUMIN in the last 168 hours. No results for input(s): LIPASE, AMYLASE in the last 168 hours. No results for input(s): AMMONIA in the last 168 hours.  CBC: No results for input(s): WBC, NEUTROABS, HGB, HCT, MCV, PLT in the last 168 hours.  Cardiac Enzymes: No results for input(s): CKTOTAL, CKMB, CKMBINDEX, TROPONINI in the last 168 hours.  BNP (last 3 results) Recent Labs     06/30/18 1051  BNP 204.7*    ProBNP (last 3 results) No results for input(s): PROBNP in the last 8760 hours.  Radiological Exams: No results found.  Assessment/Plan Active Problems:   Acute on chronic respiratory failure with hypoxia (HCC)   COPD, very severe (HCC)   Bilateral lower extremity edema   Chronic congestive heart failure (HCC)   Cardiac arrest (HCC)   Bilateral pneumonia   Anemia   Heme + stool   1. Acute on chronic respiratory failure with hypoxia patient has increasing PCO2 noted on the last blood gas done yesterday.  I have asked respiratory therapy to repeat the ABG.  My concern is with increasing oxygen he is been noted to be a little bit more somnolent.  If his PCO2 continues to climb he may need to go back on the ventilator. 2. Severe COPD at baseline we will continue with present management. 3. Bilateral edema diuresis as tolerated 4. Chronic congestive heart failure seems to be compensated continue to monitor 5. Bilateral pneumonia treated follow-up x-ray as necessary concerning for hypoxia noted at this time we are getting a follow-up ABG now would also consider getting a follow-up chest x-ray   I have personally seen and evaluated the patient, evaluated laboratory and imaging results, formulated the assessment and plan and placed orders. The Patient requires high complexity decision making for assessment and support.  Case was discussed on Rounds with the Respiratory Therapy Staff time spent 35 minutes review of the chart discussion  with the treatment team and medical staff  Allyne Gee, MD [facility] Pulmonary Critical Care Medicine Sleep Medicine

## 2018-07-19 ENCOUNTER — Other Ambulatory Visit (HOSPITAL_COMMUNITY): Payer: Medicare Other

## 2018-07-19 DIAGNOSIS — I469 Cardiac arrest, cause unspecified: Secondary | ICD-10-CM | POA: Diagnosis not present

## 2018-07-19 DIAGNOSIS — J189 Pneumonia, unspecified organism: Secondary | ICD-10-CM | POA: Diagnosis not present

## 2018-07-19 DIAGNOSIS — R600 Localized edema: Secondary | ICD-10-CM | POA: Diagnosis not present

## 2018-07-19 DIAGNOSIS — J9621 Acute and chronic respiratory failure with hypoxia: Secondary | ICD-10-CM | POA: Diagnosis not present

## 2018-07-19 NOTE — Progress Notes (Signed)
Pulmonary Critical Care Medicine [place]   PULMONARY CRITICAL CARE SERVICE  PROGRESS NOTE  Date of Service: 07/19/2018  Rick Moyer  [id number]:[id number]  DOB: 12/15/1955   DOA: 06/11/2018  Referring Physician: Merton Border, MD  HPI: Rick Moyer is a 62 y.o. male seen for follow up of Acute on Chronic Respiratory Failure.  Right now patient is on 60% oxygen.  Has been tolerating the oxygen fairly well also discussed supplemental nasal cannula.  Appears that he does mouth breathing I think that is contributing to his desaturations when the PMV was on.  After PMV was taken off his saturations did improve.  Medications: Reviewed on Rounds  Physical Exam:  Vitals: Temperature 98.4 pulse 67 respiratory rate 21 blood pressure 110/47 saturations 90%  Ventilator Settings off the ventilator at this time on T collar  . General: Comfortable at this time . Eyes: Grossly normal lids, irises & conjunctiva . ENT: grossly tongue is normal . Neck: no obvious mass . Cardiovascular: S1 S2 normal no gallop . Respiratory: No rhonchi or rales are noted . Abdomen: soft . Skin: no rash seen on limited exam . Musculoskeletal: not rigid . Psychiatric:unable to assess . Neurologic: no seizure no involuntary movements         Lab Data:   Basic Metabolic Panel: No results for input(s): NA, K, CL, CO2, GLUCOSE, BUN, CREATININE, CALCIUM, MG, PHOS in the last 168 hours.  ABG: Recent Labs  Lab 07/17/18 1800 07/18/18 1445  PHART 7.308* 7.366  PCO2ART 81.5* 72.5*  PO2ART 56.0* 40.2*  HCO3 39.6* 40.5*  O2SAT 86.4 70.8    Liver Function Tests: No results for input(s): AST, ALT, ALKPHOS, BILITOT, PROT, ALBUMIN in the last 168 hours. No results for input(s): LIPASE, AMYLASE in the last 168 hours. No results for input(s): AMMONIA in the last 168 hours.  CBC: No results for input(s): WBC, NEUTROABS, HGB, HCT, MCV, PLT in the last 168 hours.  Cardiac Enzymes: No results  for input(s): CKTOTAL, CKMB, CKMBINDEX, TROPONINI in the last 168 hours.  BNP (last 3 results) Recent Labs    06/30/18 1051  BNP 204.7*    ProBNP (last 3 results) No results for input(s): PROBNP in the last 8760 hours.  Radiological Exams: No results found.  Assessment/Plan Active Problems:   Acute on chronic respiratory failure with hypoxia (HCC)   COPD, very severe (HCC)   Bilateral lower extremity edema   Chronic congestive heart failure (HCC)   Cardiac arrest (HCC)   Bilateral pneumonia   Anemia   Heme + stool   1. Acute on chronic respiratory failure with hypoxia ABG from yesterday was noted still significantly hypoxic which surprisingly he tolerates very well.  We are going to continue with full support titrate oxygen as tolerated continue pulmonary toilet secretion management. 2. COPD severe disease we will continue with present therapy. 3. Bilateral edema continue diuresis as tolerated. 4. Chronic congestive heart failure at baseline stable we will monitor 5. Cardiac arrest rhythm is been stable 6. Bilateral pneumonia treated we will continue to follow along   I have personally seen and evaluated the patient, evaluated laboratory and imaging results, formulated the assessment and plan and placed orders. The Patient requires high complexity decision making for assessment and support.  Case was discussed on Rounds with the Respiratory Therapy Staff  Allyne Gee, MD [facility] Pulmonary Critical Care Medicine Sleep Medicine

## 2018-07-20 ENCOUNTER — Other Ambulatory Visit (HOSPITAL_COMMUNITY): Payer: Medicare Other

## 2018-07-20 DIAGNOSIS — I469 Cardiac arrest, cause unspecified: Secondary | ICD-10-CM | POA: Diagnosis not present

## 2018-07-20 DIAGNOSIS — R600 Localized edema: Secondary | ICD-10-CM | POA: Diagnosis not present

## 2018-07-20 DIAGNOSIS — J9621 Acute and chronic respiratory failure with hypoxia: Secondary | ICD-10-CM | POA: Diagnosis not present

## 2018-07-20 DIAGNOSIS — J189 Pneumonia, unspecified organism: Secondary | ICD-10-CM | POA: Diagnosis not present

## 2018-07-20 LAB — CBC
HCT: 33 % — ABNORMAL LOW (ref 39.0–52.0)
Hemoglobin: 9.4 g/dL — ABNORMAL LOW (ref 13.0–17.0)
MCH: 30.5 pg (ref 26.0–34.0)
MCHC: 28.5 g/dL — ABNORMAL LOW (ref 30.0–36.0)
MCV: 107.1 fL — ABNORMAL HIGH (ref 80.0–100.0)
NRBC: 1.4 % — AB (ref 0.0–0.2)
PLATELETS: 124 10*3/uL — AB (ref 150–400)
RBC: 3.08 MIL/uL — AB (ref 4.22–5.81)
RDW: 18.2 % — AB (ref 11.5–15.5)
WBC: 3.5 10*3/uL — ABNORMAL LOW (ref 4.0–10.5)

## 2018-07-20 LAB — BASIC METABOLIC PANEL
ANION GAP: 8 (ref 5–15)
BUN: 16 mg/dL (ref 8–23)
CHLORIDE: 96 mmol/L — AB (ref 98–111)
CO2: 35 mmol/L — ABNORMAL HIGH (ref 22–32)
Calcium: 8.6 mg/dL — ABNORMAL LOW (ref 8.9–10.3)
Creatinine, Ser: 0.67 mg/dL (ref 0.61–1.24)
GFR calc Af Amer: >60 mL/min (ref >60)
Glucose, Bld: 76 mg/dL (ref 70–99)
POTASSIUM: 4.6 mmol/L (ref 3.5–5.1)
SODIUM: 139 mmol/L (ref 135–145)

## 2018-07-20 NOTE — Progress Notes (Signed)
Pulmonary Critical Care Medicine [place]   PULMONARY CRITICAL CARE SERVICE  PROGRESS NOTE  Date of Service: 07/20/2018  IVANN Moyer  QVZ:[id number]  DOB: 21-Apr-1956   DOA: 06/11/2018  Referring Physician: Merton Border, MD  HPI: Rick Moyer is a 62 y.o. male seen for follow up of Acute on Chronic Respiratory Failure.  Patient is comfortable right now without distress.  Patient has been on T collar is on 60% oxygen by T collar the flow through the nasal cannula is off right now  Medications: Reviewed on Rounds  Physical Exam:  Vitals: Temperature 97.3 pulse 73 respiratory rate 20 blood pressure 112/62 saturation 94%  Ventilator Settings off the ventilator on T collar FiO2 60%  . General: Comfortable at this time . Eyes: Grossly normal lids, irises & conjunctiva . ENT: grossly tongue is normal . Neck: no obvious mass . Cardiovascular: S1 S2 normal no gallop . Respiratory: No rhonchi or rales . Abdomen: soft . Skin: no rash seen on limited exam . Musculoskeletal: not rigid . Psychiatric:unable to assess . Neurologic: no seizure no involuntary movements         Lab Data:   Basic Metabolic Panel: Recent Labs  Lab 07/20/18 0644  NA 139  K 4.6  CL 96*  CO2 35*  GLUCOSE 76  BUN 16  CREATININE 0.67  CALCIUM 8.6*    ABG: Recent Labs  Lab 07/17/18 1800 07/18/18 1445  PHART 7.308* 7.366  PCO2ART 81.5* 72.5*  PO2ART 56.0* 40.2*  HCO3 39.6* 40.5*  O2SAT 86.4 70.8    Liver Function Tests: No results for input(s): AST, ALT, ALKPHOS, BILITOT, PROT, ALBUMIN in the last 168 hours. No results for input(s): LIPASE, AMYLASE in the last 168 hours. No results for input(s): AMMONIA in the last 168 hours.  CBC: Recent Labs  Lab 07/20/18 0644  WBC 3.5*  HGB 9.4*  HCT 33.0*  MCV 107.1*  PLT 124*    Cardiac Enzymes: No results for input(s): CKTOTAL, CKMB, CKMBINDEX, TROPONINI in the last 168 hours.  BNP (last 3 results) Recent Labs     06/30/18 1051  BNP 204.7*    ProBNP (last 3 results) No results for input(s): PROBNP in the last 8760 hours.  Radiological Exams: Dg Chest Port 1 View  Result Date: 07/20/2018 CLINICAL DATA:  Respiratory failure. EXAM: PORTABLE CHEST 1 VIEW COMPARISON:  One-view chest x-ray 07/01/2018 FINDINGS: Tracheostomy tube is in position. The heart is enlarged. There is no significant edema. Small effusions are present. Mild bibasilar airspace disease likely reflects atelectasis. No other significant airspace consolidation is present. IMPRESSION: 1. Cardiomegaly without failure. 2. Small bilateral pleural effusions and airspace disease, likely atelectasis. Electronically Signed   By: San Morelle M.D.   On: 07/20/2018 08:37    Assessment/Plan Active Problems:   Acute on chronic respiratory failure with hypoxia (HCC)   COPD, very severe (HCC)   Bilateral lower extremity edema   Chronic congestive heart failure (HCC)   Cardiac arrest (HCC)   Bilateral pneumonia   Anemia   Heme + stool   1. Acute on chronic respiratory failure with hypoxia we will continue with wean on T collar continue secretion management pulmonary toilet. 2. Severe COPD advanced disease supportive care 3. Bilateral edema treated 4. Chronic congestive heart failure last chest x-ray shows small effusions with cardiomegaly no active failure is noted to have some atelectasis at the bases. 5. Cardiac arrest rhythm has been stable 6. Bilateral pneumonia no clear-cut pneumonia noted on  the chest films we will continue to monitor   I have personally seen and evaluated the patient, evaluated laboratory and imaging results, formulated the assessment and plan and placed orders. The Patient requires high complexity decision making for assessment and support.  Case was discussed on Rounds with the Respiratory Therapy Staff  Allyne Gee, MD [facility] Pulmonary Critical Care Medicine Sleep Medicine

## 2018-07-21 DIAGNOSIS — J189 Pneumonia, unspecified organism: Secondary | ICD-10-CM | POA: Diagnosis not present

## 2018-07-21 DIAGNOSIS — R600 Localized edema: Secondary | ICD-10-CM | POA: Diagnosis not present

## 2018-07-21 DIAGNOSIS — J9621 Acute and chronic respiratory failure with hypoxia: Secondary | ICD-10-CM | POA: Diagnosis not present

## 2018-07-21 DIAGNOSIS — I469 Cardiac arrest, cause unspecified: Secondary | ICD-10-CM | POA: Diagnosis not present

## 2018-07-21 NOTE — Progress Notes (Signed)
Pulmonary Critical Care Medicine [place]   PULMONARY CRITICAL CARE SERVICE  PROGRESS NOTE  Date of Service: 07/21/2018  Rick Moyer  [id number]  DOB: 1956/08/14   DOA: 06/11/2018  Referring Physician: Merton Border, MD  HPI: Rick Moyer is a 62 y.o. male seen for follow up of Acute on Chronic Respiratory Failure.  Patient is on T collar at this time is been on 60% oxygen comfortable right now without distress.  Medications: Reviewed on Rounds  Physical Exam:  Vitals: Temperature 97.0 pulse 88 respiratory rate 16 blood pressure 124/72 saturations 91%  Ventilator Settings off the ventilator right now on T collar  . General: Comfortable at this time . Eyes: Grossly normal lids, irises & conjunctiva . ENT: grossly tongue is normal . Neck: no obvious mass . Cardiovascular: S1 S2 normal no gallop . Respiratory: Coarse rhonchi noted . Abdomen: soft . Skin: no rash seen on limited exam . Musculoskeletal: not rigid . Psychiatric:unable to assess . Neurologic: no seizure no involuntary movements         Lab Data:   Basic Metabolic Panel: Recent Labs  Lab 07/20/18 0644  NA 139  K 4.6  CL 96*  CO2 35*  GLUCOSE 76  BUN 16  CREATININE 0.67  CALCIUM 8.6*    ABG: Recent Labs  Lab 07/17/18 1800 07/18/18 1445  PHART 7.308* 7.366  PCO2ART 81.5* 72.5*  PO2ART 56.0* 40.2*  HCO3 39.6* 40.5*  O2SAT 86.4 70.8    Liver Function Tests: No results for input(s): AST, ALT, ALKPHOS, BILITOT, PROT, ALBUMIN in the last 168 hours. No results for input(s): LIPASE, AMYLASE in the last 168 hours. No results for input(s): AMMONIA in the last 168 hours.  CBC: Recent Labs  Lab 07/20/18 0644  WBC 3.5*  HGB 9.4*  HCT 33.0*  MCV 107.1*  PLT 124*    Cardiac Enzymes: No results for input(s): CKTOTAL, CKMB, CKMBINDEX, TROPONINI in the last 168 hours.  BNP (last 3 results) Recent Labs    06/30/18 1051  BNP 204.7*    ProBNP (last 3  results) No results for input(s): PROBNP in the last 8760 hours.  Radiological Exams: Dg Chest Port 1 View  Result Date: 07/20/2018 CLINICAL DATA:  Respiratory failure. EXAM: PORTABLE CHEST 1 VIEW COMPARISON:  One-view chest x-ray 07/01/2018 FINDINGS: Tracheostomy tube is in position. The heart is enlarged. There is no significant edema. Small effusions are present. Mild bibasilar airspace disease likely reflects atelectasis. No other significant airspace consolidation is present. IMPRESSION: 1. Cardiomegaly without failure. 2. Small bilateral pleural effusions and airspace disease, likely atelectasis. Electronically Signed   By: San Morelle M.D.   On: 07/20/2018 08:37    Assessment/Plan Active Problems:   Acute on chronic respiratory failure with hypoxia (HCC)   COPD, very severe (HCC)   Bilateral lower extremity edema   Chronic congestive heart failure (HCC)   Cardiac arrest (HCC)   Bilateral pneumonia   Anemia   Heme + stool   1. Acute on chronic respiratory failure with hypoxia we will continue with oxygen therapy through the T collar.  Right now is not requiring anything through the nasal cannula. 2. Severe COPD advanced disease continue with present management continue aggressive pulmonary toilet 3. Bilateral edema diuresis as tolerated. 4. Congestive heart failure still has significant cardiomegaly but no active failure right now 5. Bilateral pneumonia treated we will continue with supportive care 6. Cardiac arrest rhythm is been stable we will continue to monitor  I have personally seen and evaluated the patient, evaluated laboratory and imaging results, formulated the assessment and plan and placed orders. The Patient requires high complexity decision making for assessment and support.  Case was discussed on Rounds with the Respiratory Therapy Staff  Allyne Gee, MD [facility] Pulmonary Critical Care Medicine Sleep Medicine

## 2018-07-22 ENCOUNTER — Other Ambulatory Visit (HOSPITAL_COMMUNITY): Payer: Medicare Other

## 2018-07-22 DIAGNOSIS — R600 Localized edema: Secondary | ICD-10-CM | POA: Diagnosis not present

## 2018-07-22 DIAGNOSIS — J189 Pneumonia, unspecified organism: Secondary | ICD-10-CM | POA: Diagnosis not present

## 2018-07-22 DIAGNOSIS — J9621 Acute and chronic respiratory failure with hypoxia: Secondary | ICD-10-CM | POA: Diagnosis not present

## 2018-07-22 DIAGNOSIS — I469 Cardiac arrest, cause unspecified: Secondary | ICD-10-CM | POA: Diagnosis not present

## 2018-07-22 NOTE — Progress Notes (Signed)
  Echocardiogram 2D Echocardiogram limited bubble has been performed.  Darlina Sicilian M 07/22/2018, 8:40 AM

## 2018-07-22 NOTE — Progress Notes (Signed)
Pulmonary Critical Care Medicine [place]   PULMONARY CRITICAL CARE SERVICE  PROGRESS NOTE  Date of Service: 07/22/2018  Rick Moyer  XNT:[id number]  DOB: [birth year]   DOA: 06/11/2018  Referring Physician: Merton Border, MD  HPI: Rick Moyer is a 62 y.o. male seen for follow up of Acute on Chronic Respiratory Failure.  Patient is right now on T collar currently on 60% FiO2.  Discussed the possibility of using Oxymizer he is actually tolerating the current oxygen settings fairly well his saturations have been above the 90% mark  Medications: Reviewed on Rounds  Physical Exam:  Vitals: Temperature 97.6 pulse 74 respiratory rate 16 blood pressure 121/73 saturation 93%  Ventilator Settings off the ventilator on T collar  . General: Comfortable at this time . Eyes: Grossly normal lids, irises & conjunctiva . ENT: grossly tongue is normal . Neck: no obvious mass . Cardiovascular: S1 S2 normal no gallop . Respiratory: No rhonchi or rales are noted at this time . Abdomen: soft . Skin: no rash seen on limited exam . Musculoskeletal: not rigid . Psychiatric:unable to assess . Neurologic: no seizure no involuntary movements         Lab Data:   Basic Metabolic Panel: Recent Labs  Lab 07/20/18 0644  NA 139  K 4.6  CL 96*  CO2 35*  GLUCOSE 76  BUN 16  CREATININE 0.67  CALCIUM 8.6*    ABG: Recent Labs  Lab 07/17/18 1800 07/18/18 1445  PHART 7.308* 7.366  PCO2ART 81.5* 72.5*  PO2ART 56.0* 40.2*  HCO3 39.6* 40.5*  O2SAT 86.4 70.8    Liver Function Tests: No results for input(s): AST, ALT, ALKPHOS, BILITOT, PROT, ALBUMIN in the last 168 hours. No results for input(s): LIPASE, AMYLASE in the last 168 hours. No results for input(s): AMMONIA in the last 168 hours.  CBC: Recent Labs  Lab 07/20/18 0644  WBC 3.5*  HGB 9.4*  HCT 33.0*  MCV 107.1*  PLT 124*    Cardiac Enzymes: No results for input(s): CKTOTAL, CKMB, CKMBINDEX,  TROPONINI in the last 168 hours.  BNP (last 3 results) Recent Labs    06/30/18 1051  BNP 204.7*    ProBNP (last 3 results) No results for input(s): PROBNP in the last 8760 hours.  Radiological Exams: No results found.  Assessment/Plan Active Problems:   Acute on chronic respiratory failure with hypoxia (HCC)   COPD, very severe (HCC)   Bilateral lower extremity edema   Chronic congestive heart failure (HCC)   Cardiac arrest (HCC)   Bilateral pneumonia   Anemia   Heme + stool   1. Acute on chronic respiratory failure with hypoxia we will continue to place him on Oxymizer today titrate his oxygen saturations greater than 86% if possible 2. Severe COPD advanced disease prognosis guarded 3. Of bilateral lower extremity edema stable 4. We will monitor 5. Chronic congestive heart failure the patient had a bubble study done to assess for any shunting we will continue with supportive care 6. Cardiac arrest rhythm is stable 7. Bilateral pneumonia treated   I have personally seen and evaluated the patient, evaluated laboratory and imaging results, formulated the assessment and plan and placed orders. The Patient requires high complexity decision making for assessment and support.  Case was discussed on Rounds with the Respiratory Therapy Staff  Allyne Gee, MD [facility] Pulmonary Critical Care Medicine Sleep Medicine

## 2018-07-23 DIAGNOSIS — J189 Pneumonia, unspecified organism: Secondary | ICD-10-CM | POA: Diagnosis not present

## 2018-07-23 DIAGNOSIS — I469 Cardiac arrest, cause unspecified: Secondary | ICD-10-CM | POA: Diagnosis not present

## 2018-07-23 DIAGNOSIS — J9621 Acute and chronic respiratory failure with hypoxia: Secondary | ICD-10-CM | POA: Diagnosis not present

## 2018-07-23 DIAGNOSIS — R600 Localized edema: Secondary | ICD-10-CM | POA: Diagnosis not present

## 2018-07-23 NOTE — Progress Notes (Signed)
Pulmonary Critical Care Medicine [place]   PULMONARY CRITICAL CARE SERVICE  PROGRESS NOTE  Date of Service: 07/23/2018  Rick Moyer  [id number]  DOB: 30-Apr-1956   DOA: 06/11/2018  Referring Physician: Merton Border, MD  HPI: Rick Moyer is a 62 y.o. male seen for follow up of Acute on Chronic Respiratory Failure.  Doing better is on 3 L Oxymizer has also been capping.  His saturations are actually very good around 9495%  Medications: Reviewed on Rounds  Physical Exam:  Vitals: Temperature 97.1 pulse 68 respiratory rate 18 blood pressure is 135/70 saturations 96% right now  Ventilator Settings off the ventilator on 3 L Oxymizer  . General: Comfortable at this time . Eyes: Grossly normal lids, irises & conjunctiva . ENT: grossly tongue is normal . Neck: no obvious mass . Cardiovascular: S1 S2 normal no gallop . Respiratory: No rhonchi no rales are noted . Abdomen: soft . Skin: no rash seen on limited exam . Musculoskeletal: not rigid . Psychiatric:unable to assess . Neurologic: no seizure no involuntary movements         Lab Data:   Basic Metabolic Panel: Recent Labs  Lab 07/20/18 0644  NA 139  K 4.6  CL 96*  CO2 35*  GLUCOSE 76  BUN 16  CREATININE 0.67  CALCIUM 8.6*    ABG: Recent Labs  Lab 07/17/18 1800 07/18/18 1445  PHART 7.308* 7.366  PCO2ART 81.5* 72.5*  PO2ART 56.0* 40.2*  HCO3 39.6* 40.5*  O2SAT 86.4 70.8    Liver Function Tests: No results for input(s): AST, ALT, ALKPHOS, BILITOT, PROT, ALBUMIN in the last 168 hours. No results for input(s): LIPASE, AMYLASE in the last 168 hours. No results for input(s): AMMONIA in the last 168 hours.  CBC: Recent Labs  Lab 07/20/18 0644  WBC 3.5*  HGB 9.4*  HCT 33.0*  MCV 107.1*  PLT 124*    Cardiac Enzymes: No results for input(s): CKTOTAL, CKMB, CKMBINDEX, TROPONINI in the last 168 hours.  BNP (last 3 results) Recent Labs    06/30/18 1051  BNP 204.7*     ProBNP (last 3 results) No results for input(s): PROBNP in the last 8760 hours.  Radiological Exams: No results found.  Assessment/Plan Active Problems:   Acute on chronic respiratory failure with hypoxia (HCC)   COPD, very severe (HCC)   Bilateral lower extremity edema   Chronic congestive heart failure (HCC)   Cardiac arrest (HCC)   Bilateral pneumonia   Anemia   Heme + stool   1. Acute on chronic respiratory failure with hypoxia we will continue with Oxymizer on 3 L flow rate.  Doing fairly well. 2. Severe COPD at baseline continue present therapy 3. Cardiac arrest rhythm is been stable 4. Bilateral pneumonia treated 5. Chronic congestive heart failure doing better we will continue to monitor had an echocardiogram done which reveals no ASD and ejection fraction was in the 50 to 55% range 6. Bilateral edema treated continue to monitor fluid status   I have personally seen and evaluated the patient, evaluated laboratory and imaging results, formulated the assessment and plan and placed orders. The Patient requires high complexity decision making for assessment and support.  Case was discussed on Rounds with the Respiratory Therapy Staff  Allyne Gee, MD [facility] Pulmonary Critical Care Medicine Sleep Medicine

## 2019-07-03 DEATH — deceased

## 2019-12-18 IMAGING — DX DG CHEST 1V PORT
1 series · 1 of 1 positions shown · non-contrast
Comparison: 06/20/2018 chest radiograph.

CLINICAL DATA: 62 y/o M; tracheostomy tube in place. Respiratory
failure and history of pneumonia.

EXAM:
PORTABLE CHEST 1 VIEW

[chest]
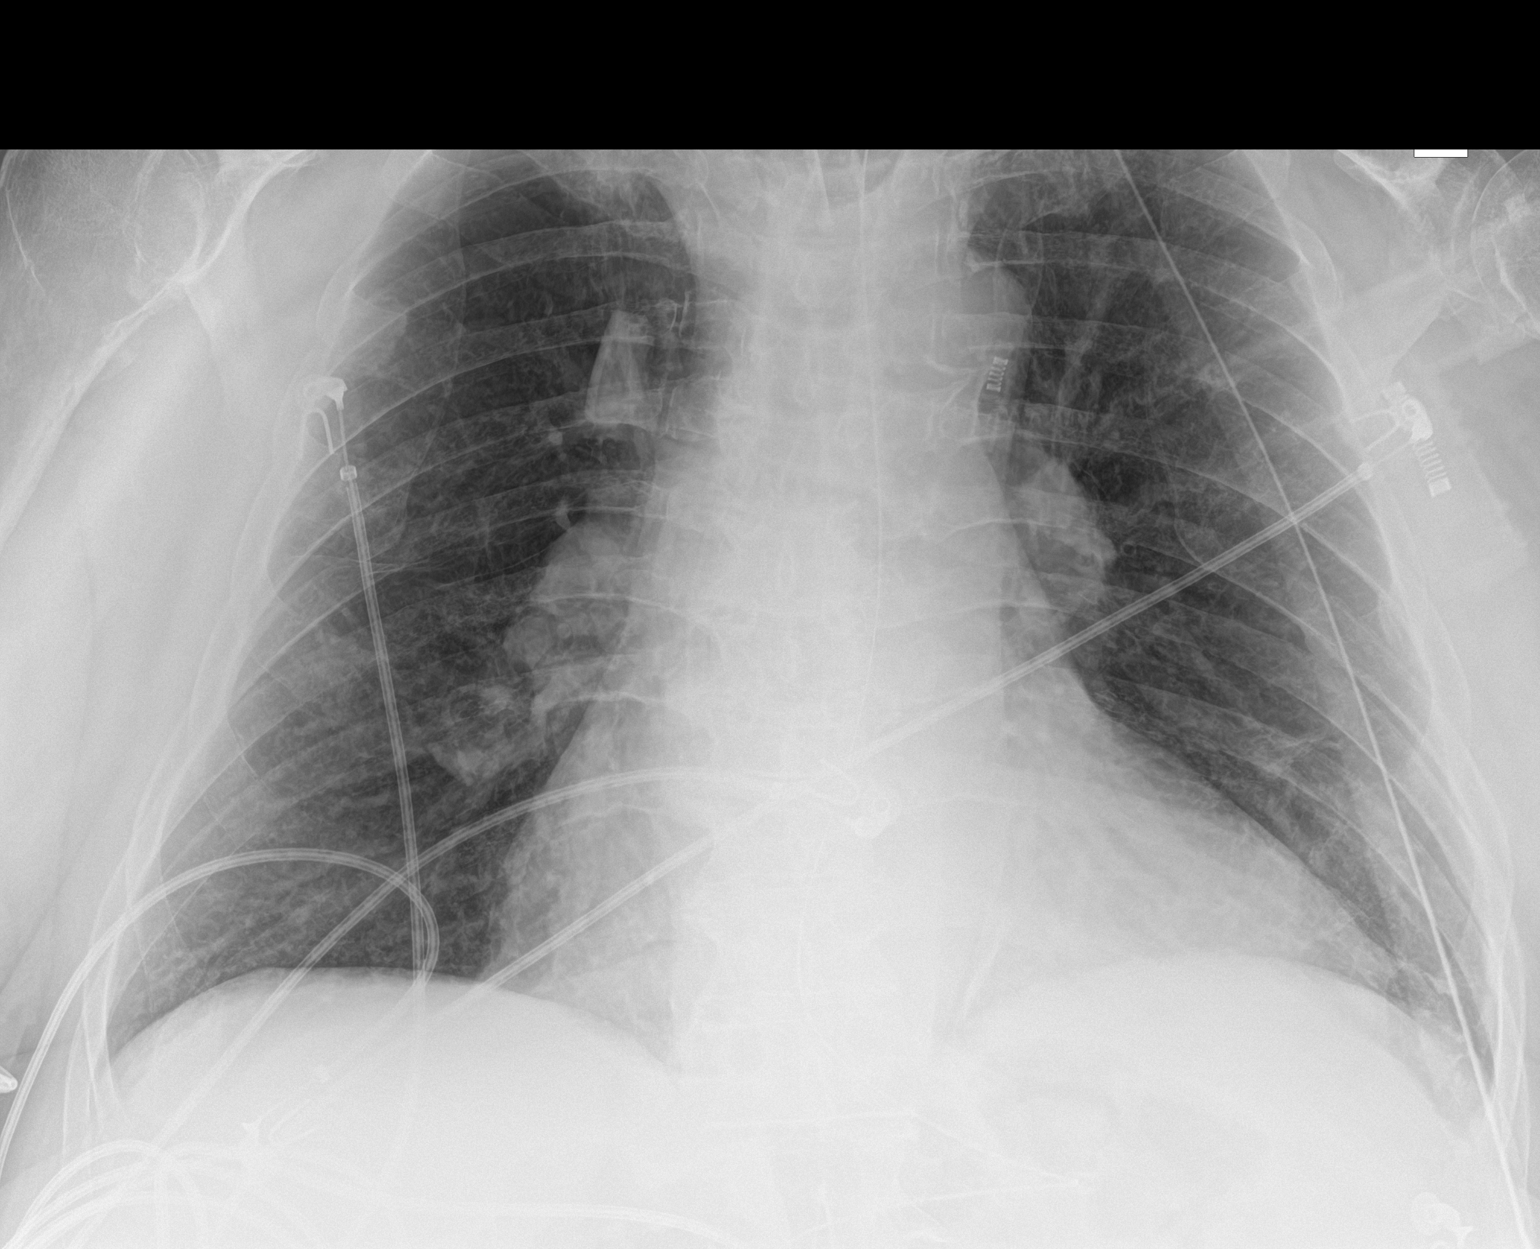

[1 of 1 positions shown; findings below may reference images not displayed]

FINDINGS: Stable cardiomegaly given projection and technique. Aortic
atherosclerosis with calcification. Stable mild opacities with
basilar predominance. Stable tracheostomy tube with tip 6.6 cm above
the carina. Enteric tube tip extends below the field of view into
the abdomen. No pleural effusion or pneumothorax. No acute osseous
abnormality is evident.
IMPRESSION: 1. Stable mild pulmonary opacities with basilar predominance.
2. Tracheostomy and enteric tubes are stable.
3. Stable cardiomegaly given projection and technique.

By: [place] Lauren Amizo M.D.

## 2019-12-19 IMAGING — CT CT ANGIO CHEST
2 of 8 series · 17 of 46 positions shown · IV contrast (iopamidol)
Comparison: None.

CLINICAL DATA: Hypoxia, interstitial lung disease

EXAM:
CT ANGIOGRAPHY CHEST WITH CONTRAST
TECHNIQUE: Multidetector CT imaging of the chest was performed using the
standard protocol during bolus administration of intravenous
contrast. Multiplanar CT image reconstructions and MIPs were
obtained to evaluate the vascular anatomy.
CONTRAST:  100mL [id number] IOPAMIDOL ([id number]) INJECTION 76%

[Series 6: thins · axial · 0.94mm/px · z∈[+1308,+1636]mm · 14 of 362 slices shown]
[im 17/362  lung]
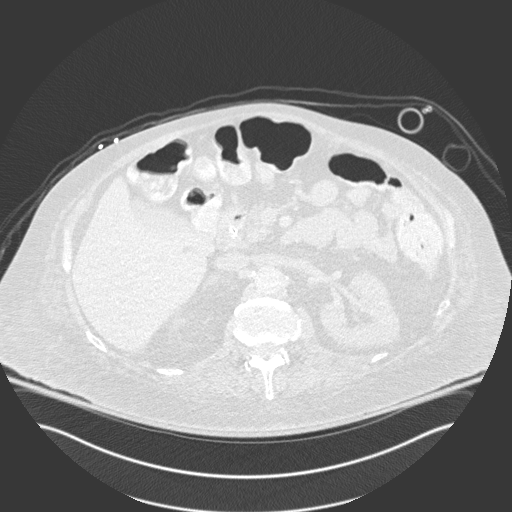
[im 50/362  soft-tissue]
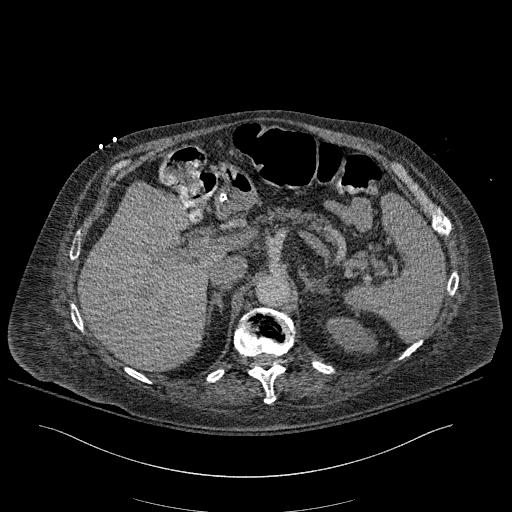
[im 66/362  lung]
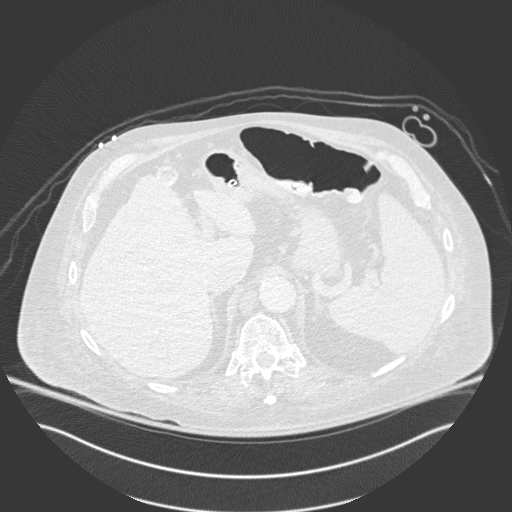
[im 99/362  soft-tissue]
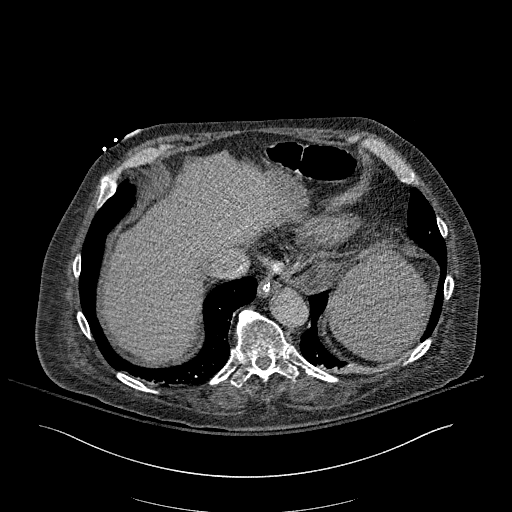
[im 115/362  lung]
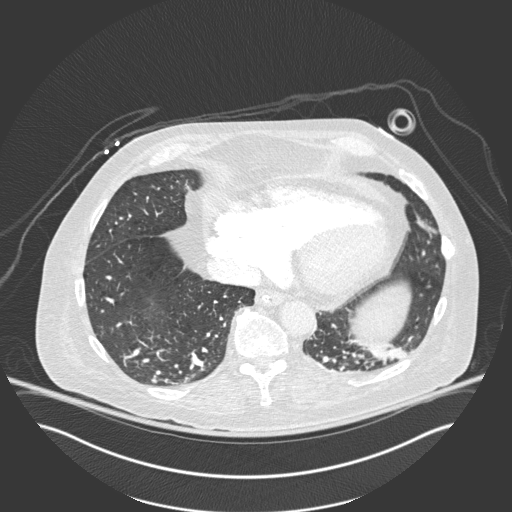
[im 148/362  soft-tissue]
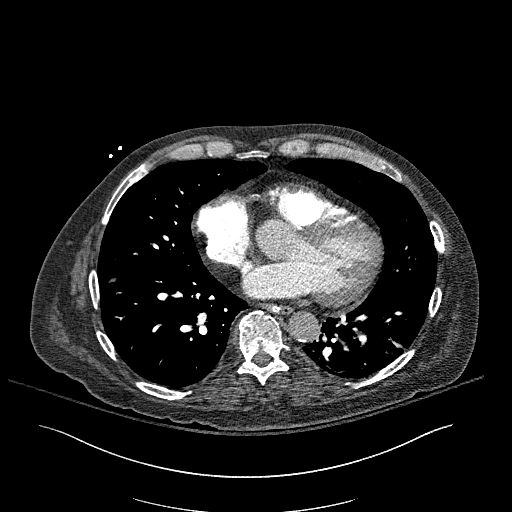
[im 165/362  lung]
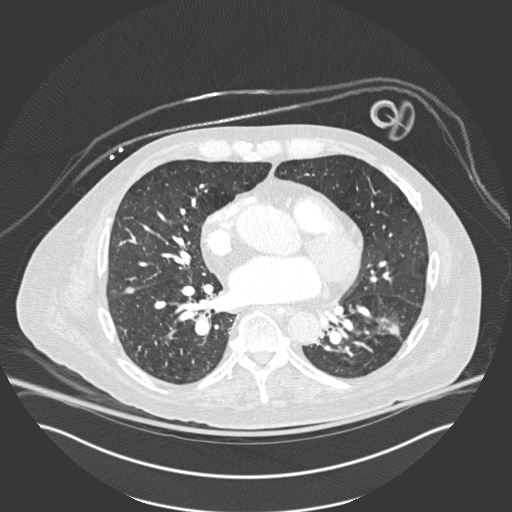
[im 197/362  soft-tissue]
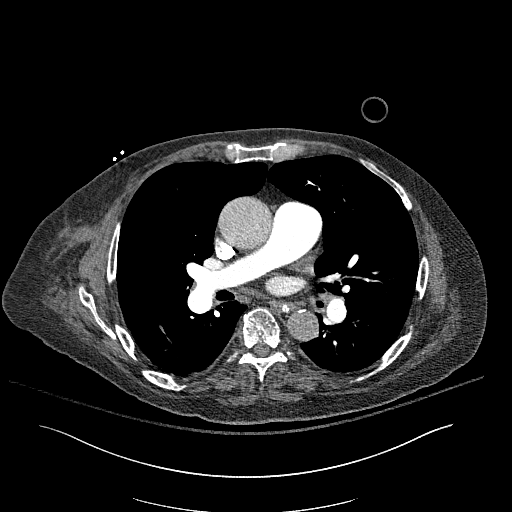
[im 214/362  lung]
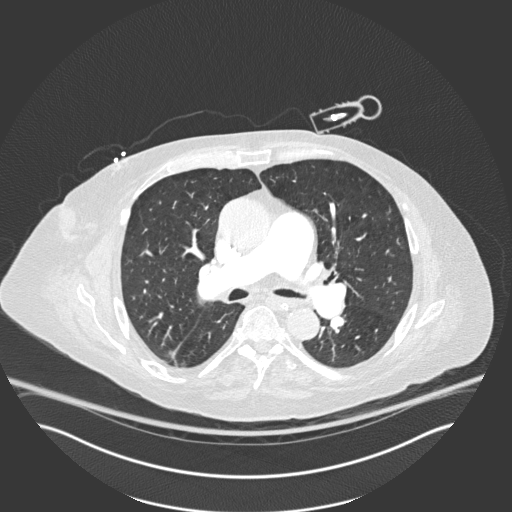
[im 247/362  soft-tissue]
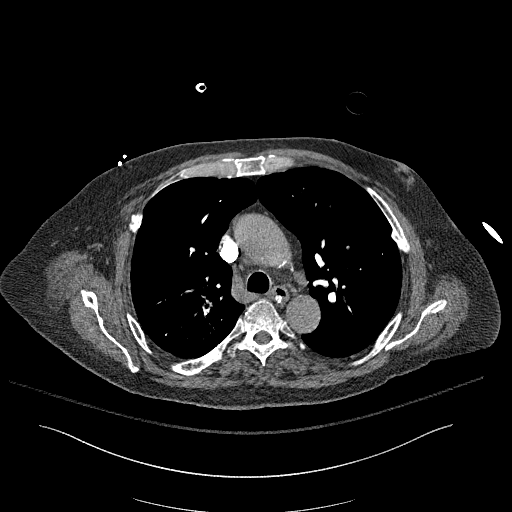
[im 263/362  lung]
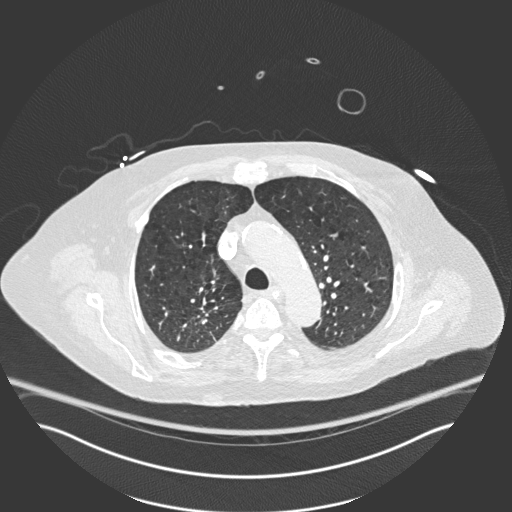
[im 296/362  soft-tissue]
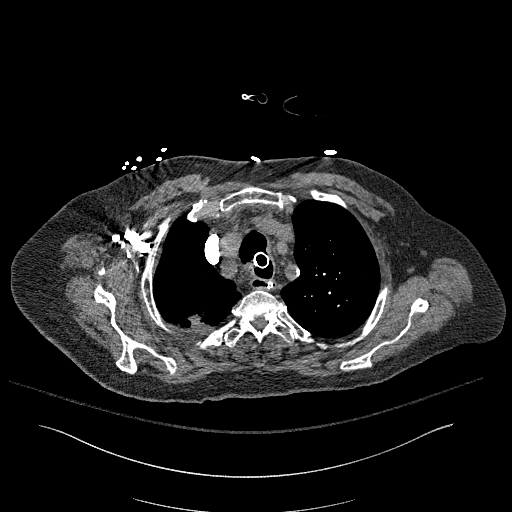
[im 312/362  lung]
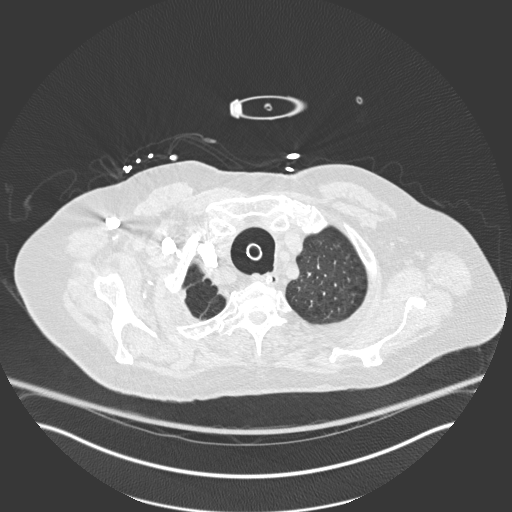
[im 345/362  soft-tissue]
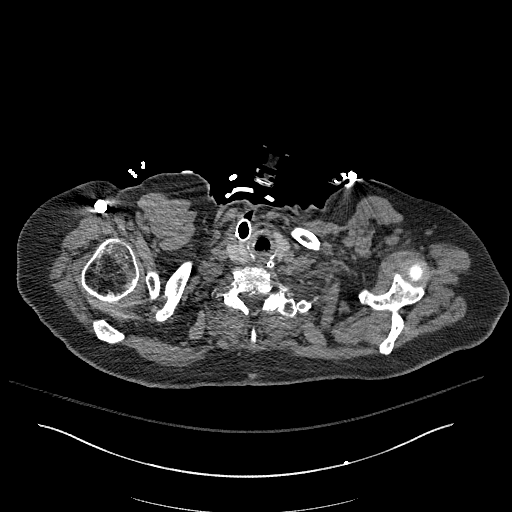

[Series 8: coronal mpr · coronal · 0.78mm/px · 3 of 185 slices shown]
[im 47/185  soft-tissue]
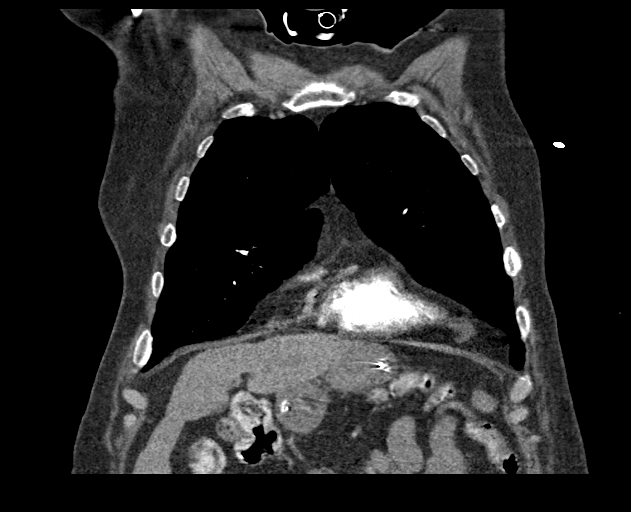
[im 93/185  soft-tissue]
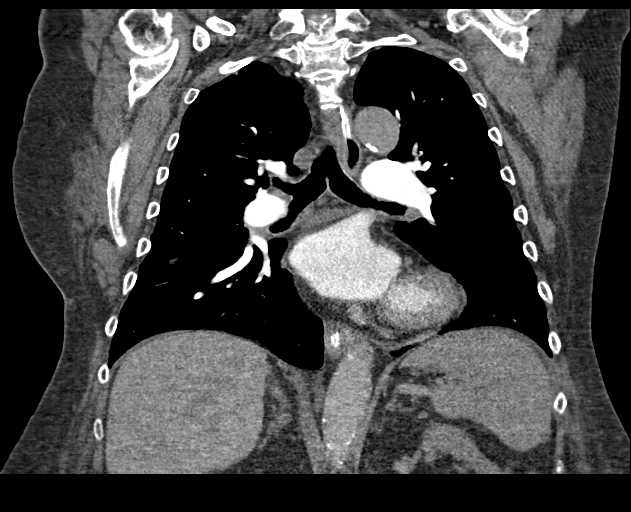
[im 139/185  soft-tissue]
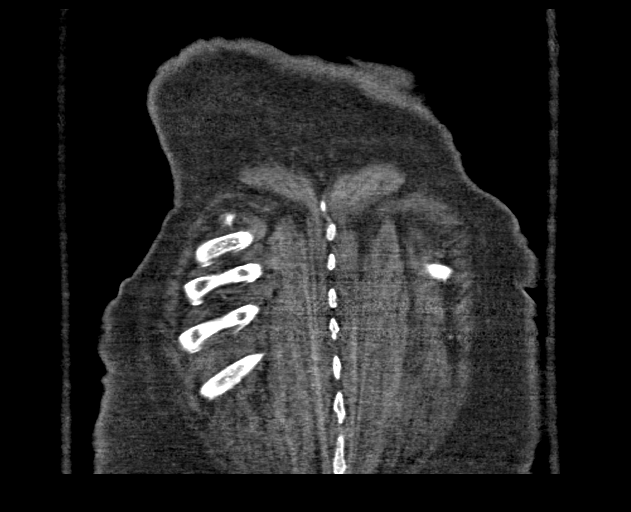

[17 of 46 positions shown; findings below may reference images not displayed]

FINDINGS: Cardiovascular: Four-chamber cardiac enlargement. Trace pericardial
effusion. Dilatation of central pulmonary arteries. Satisfactory
opacification of pulmonary arteries noted, and there is no evidence
of pulmonary emboli. Scattered coronary calcifications and aortic
leaflet calcifications. Ascending aorta is dilated to 4.9 cm
diameter, proximal arch 3.9 cm. Fair aortic opacification with no
suggestion of dissection or stenosis. Classic 3 vessel
brachiocephalic arterial origin anatomy. Atheromatous plaque in the
visualized proximal abdominal aorta without aneurysm.

Mediastinum/Nodes: No hilar or mediastinal adenopathy. Tracheostomy
device projects in expected location.

Lungs/Pleura: No pleural effusion. No pneumothorax. 2.2 cm
spiculated pleural based lesion in the posterior apical right upper
lobe. Pulmonary emphysema. 2.4 cm semi-solid lesion in the superior
segment right lower lobe. Some linear scarring in the left lower
lobe. Left lung otherwise clear.

Upper Abdomen: Enteric tube extends to the proximal duodenum. 5 mm
upper pole left renal calculus. No acute findings..

Musculoskeletal: Mild compression deformity of T9. Mild superior
endplate compression deformity of L1. Spondylitic changes in the
visualized lower cervical spine. Healing bilateral rib and sternal
fractures.

Review of the MIP images confirms the above findings.
IMPRESSION: 1. Negative for acute PE.
2. 2.2 right upper lobe and 2.4 cm right lower lobe lung lesions.
Consultation with pulmonary medicine or thoracic surgery suggested,
as clinically appropriate.
3. Coronary and Aortic Atherosclerosis (3PYVE-170.0).
4. 4.9 cm ascending thoracic aortic aneurysm NOS ([id number]).
Ascending thoracic aortic aneurysm. Recommend semi-annual imaging
followup by CTA or MRA and referral to cardiothoracic surgery if not
already obtained. This recommendation follows 2818
ACCF/AHA/AATS/ACR/ASA/SCA/VENCES/KRAMER/RONLOR/JUMPER Guidelines for the
Diagnosis and Management of Patients With Thoracic Aortic Disease.
Circulation. 2818; 121: [id number]
5. Compression deformities of T9 and L1, age indeterminate.
6. Left nephrolithiasis
